# Patient Record
Sex: Female | Born: 1990 | Race: White | Hispanic: No | Smoking: Never smoker
Health system: Southern US, Community
[De-identification: ages and names within clinical notes are randomized; demographics above are authoritative.]

## PROBLEM LIST (undated history)

## (undated) ENCOUNTER — Inpatient Hospital Stay (HOSPITAL_COMMUNITY): Payer: Self-pay

## (undated) DIAGNOSIS — Z789 Other specified health status: Secondary | ICD-10-CM

## (undated) DIAGNOSIS — F32A Depression, unspecified: Secondary | ICD-10-CM

## (undated) HISTORY — DX: Depression, unspecified: F32.A

## (undated) HISTORY — PX: NO PAST SURGERIES: SHX2092

---

## 2016-03-13 LAB — OB RESULTS CONSOLE GC/CHLAMYDIA
Chlamydia: NEGATIVE
Gonorrhea: NEGATIVE

## 2016-03-26 NOTE — L&D Delivery Note (Signed)
  Andrea Romero, Andrea Romero [[id number]]  Delivery Note At 2:49 AM a viable female was delivered via  (Presentation: LOA).  APGAR: 1, 6, 9; weight 1180g.  Cord clamped and cut immediately and baby taken to warmer to be assessed by NICU team. See NICU note. Placenta status: Spontaneous, intact.  Cord: 3 vessels.  Cord pH: 7.29  Anesthesia:  none Episiotomy:  none Lacerations:  none Suture Repair: n/a Est. Blood Loss (mL):  500  Mom to postpartum.  Baby to NICU.  Andrea Romero SNM 08/11/2016, 3:36 AM     Andrea Romero, Andrea Romero [[id number]]  Delivery Note After approximately 15 minutes, patient began to feel the urge to push. Heart rate was in the 70's. Pushed with approximately 3 contractions. At 3:06 AM a viable female was delivered via  (Presentation: LOA).  APGAR: 7, 8; weight pending. Cord clamped and cut immediately and baby taken to warmer to be assessed by NICU team. See NICU note.    Placenta status: spontaneous, intact.  Cord: 3 vessels with a nuchal x1. Cord pH: 7.18  Anesthesia:  none Episiotomy:  none Lacerations:  none Suture Repair: n/a Est. Blood Loss (mL):  500  Mom to postpartum.  Baby to NICU.  Andrea Romero 08/11/2016, 3:36 AM

## 2016-03-27 DIAGNOSIS — Z3482 Encounter for supervision of other normal pregnancy, second trimester: Secondary | ICD-10-CM | POA: Diagnosis not present

## 2016-03-27 LAB — OB RESULTS CONSOLE HEPATITIS B SURFACE ANTIGEN: Hepatitis B Surface Ag: NEGATIVE

## 2016-03-27 LAB — OB RESULTS CONSOLE HIV ANTIBODY (ROUTINE TESTING): HIV: NONREACTIVE

## 2016-03-27 LAB — OB RESULTS CONSOLE HGB/HCT, BLOOD
HEMATOCRIT: 36 %
HEMOGLOBIN: 12.5 g/dL

## 2016-03-27 LAB — OB RESULTS CONSOLE ABO/RH: RH TYPE: POSITIVE

## 2016-03-27 LAB — OB RESULTS CONSOLE ANTIBODY SCREEN: ANTIBODY SCREEN: NEGATIVE

## 2016-03-27 LAB — OB RESULTS CONSOLE RPR: RPR: NONREACTIVE

## 2016-03-27 LAB — OB RESULTS CONSOLE RUBELLA ANTIBODY, IGM: Rubella: IMMUNE

## 2016-04-02 DIAGNOSIS — Z3A13 13 weeks gestation of pregnancy: Secondary | ICD-10-CM | POA: Diagnosis not present

## 2016-04-02 DIAGNOSIS — O30031 Twin pregnancy, monochorionic/diamniotic, first trimester: Secondary | ICD-10-CM | POA: Diagnosis not present

## 2016-04-02 DIAGNOSIS — Z3682 Encounter for antenatal screening for nuchal translucency: Secondary | ICD-10-CM | POA: Diagnosis not present

## 2016-04-23 DIAGNOSIS — Z8481 Family history of carrier of genetic disease: Secondary | ICD-10-CM | POA: Diagnosis not present

## 2016-04-23 DIAGNOSIS — O30032 Twin pregnancy, monochorionic/diamniotic, second trimester: Secondary | ICD-10-CM | POA: Diagnosis not present

## 2016-04-23 DIAGNOSIS — Z3A16 16 weeks gestation of pregnancy: Secondary | ICD-10-CM | POA: Diagnosis not present

## 2016-05-30 DIAGNOSIS — O26872 Cervical shortening, second trimester: Secondary | ICD-10-CM | POA: Diagnosis not present

## 2016-05-30 DIAGNOSIS — O30032 Twin pregnancy, monochorionic/diamniotic, second trimester: Secondary | ICD-10-CM | POA: Diagnosis not present

## 2016-05-30 DIAGNOSIS — Z3A21 21 weeks gestation of pregnancy: Secondary | ICD-10-CM | POA: Diagnosis not present

## 2016-05-30 DIAGNOSIS — O365920 Maternal care for other known or suspected poor fetal growth, second trimester, not applicable or unspecified: Secondary | ICD-10-CM | POA: Diagnosis not present

## 2016-06-06 ENCOUNTER — Encounter (HOSPITAL_COMMUNITY): Payer: Self-pay

## 2016-06-08 ENCOUNTER — Other Ambulatory Visit (HOSPITAL_COMMUNITY): Payer: Self-pay | Admitting: Maternal and Fetal Medicine

## 2016-06-08 DIAGNOSIS — O365931 Maternal care for other known or suspected poor fetal growth, third trimester, fetus 1: Secondary | ICD-10-CM

## 2016-06-08 DIAGNOSIS — O30039 Twin pregnancy, monochorionic/diamniotic, unspecified trimester: Secondary | ICD-10-CM

## 2016-06-08 DIAGNOSIS — Z3A23 23 weeks gestation of pregnancy: Secondary | ICD-10-CM

## 2016-06-12 ENCOUNTER — Encounter (HOSPITAL_COMMUNITY): Payer: Self-pay | Admitting: *Deleted

## 2016-06-13 ENCOUNTER — Ambulatory Visit (HOSPITAL_COMMUNITY)
Admission: RE | Admit: 2016-06-13 | Discharge: 2016-06-13 | Disposition: A | Discharge status: Home / Self Care | Payer: Medicaid Other | Source: Ambulatory Visit | Attending: Maternal and Fetal Medicine | Admitting: Maternal and Fetal Medicine

## 2016-06-13 ENCOUNTER — Encounter (HOSPITAL_COMMUNITY): Payer: Self-pay

## 2016-06-13 DIAGNOSIS — Z3689 Encounter for other specified antenatal screening: Secondary | ICD-10-CM | POA: Insufficient documentation

## 2016-06-13 DIAGNOSIS — Z3A23 23 weeks gestation of pregnancy: Secondary | ICD-10-CM | POA: Insufficient documentation

## 2016-06-13 DIAGNOSIS — O365921 Maternal care for other known or suspected poor fetal growth, second trimester, fetus 1: Secondary | ICD-10-CM | POA: Insufficient documentation

## 2016-06-13 DIAGNOSIS — O30039 Twin pregnancy, monochorionic/diamniotic, unspecified trimester: Secondary | ICD-10-CM

## 2016-06-13 DIAGNOSIS — O365931 Maternal care for other known or suspected poor fetal growth, third trimester, fetus 1: Secondary | ICD-10-CM

## 2016-06-13 DIAGNOSIS — O30032 Twin pregnancy, monochorionic/diamniotic, second trimester: Secondary | ICD-10-CM | POA: Diagnosis not present

## 2016-06-13 HISTORY — DX: Other specified health status: Z78.9

## 2016-06-14 ENCOUNTER — Other Ambulatory Visit (HOSPITAL_COMMUNITY): Payer: Self-pay | Admitting: *Deleted

## 2016-06-14 DIAGNOSIS — O365911 Maternal care for other known or suspected poor fetal growth, first trimester, fetus 1: Secondary | ICD-10-CM

## 2016-06-16 ENCOUNTER — Encounter (HOSPITAL_COMMUNITY): Payer: Self-pay

## 2016-06-16 ENCOUNTER — Inpatient Hospital Stay (HOSPITAL_COMMUNITY)
Admission: AD | Admit: 2016-06-16 | Discharge: 2016-06-16 | Disposition: A | Discharge status: Home / Self Care | Payer: Medicaid Other | Source: Ambulatory Visit | Attending: Obstetrics and Gynecology | Admitting: Obstetrics and Gynecology

## 2016-06-16 DIAGNOSIS — O99612 Diseases of the digestive system complicating pregnancy, second trimester: Secondary | ICD-10-CM | POA: Insufficient documentation

## 2016-06-16 DIAGNOSIS — R079 Chest pain, unspecified: Secondary | ICD-10-CM | POA: Diagnosis present

## 2016-06-16 DIAGNOSIS — K219 Gastro-esophageal reflux disease without esophagitis: Secondary | ICD-10-CM

## 2016-06-16 DIAGNOSIS — Z3A24 24 weeks gestation of pregnancy: Secondary | ICD-10-CM | POA: Insufficient documentation

## 2016-06-16 LAB — URINALYSIS, ROUTINE W REFLEX MICROSCOPIC
Bilirubin Urine: NEGATIVE
GLUCOSE, UA: 50 mg/dL — AB
HGB URINE DIPSTICK: NEGATIVE
KETONES UR: NEGATIVE mg/dL
Leukocytes, UA: NEGATIVE
Nitrite: NEGATIVE
PH: 7 (ref 5.0–8.0)
PROTEIN: NEGATIVE mg/dL
Specific Gravity, Urine: 1.008 (ref 1.005–1.030)

## 2016-06-16 MED ORDER — GI COCKTAIL ~~LOC~~
30.0000 mL | Freq: Once | ORAL | Status: AC
  Administered 2016-06-16: 30 mL via ORAL
  Filled 2016-06-16: qty 30

## 2016-06-16 NOTE — MAU Provider Note (Signed)
History   G3P0020 @ 24.1 wks twin gestation in with burning chest pain for several days duration. Pt gets PNC at [facility] in [place]. Pt states has pessary in place.  CSN: [id number]  Arrival date & time 06/16/16  2027   None     Chief Complaint  Patient presents with  . Chest Pain  . Abdominal Pain  . Shortness of Breath    HPI  Past Medical History:  Diagnosis Date  . Medical history non-contributory     Past Surgical History:  Procedure Laterality Date  . NO PAST SURGERIES      No family history on file.  Social History  Substance Use Topics  . Smoking status: Never Smoker  . Smokeless tobacco: Never Used  . Alcohol use No    OB History    Gravida Para Term Preterm AB Living   3       2     SAB TAB Ectopic Multiple Live Births   2              Review of Systems  Constitutional: Negative.   HENT: Negative.   Eyes: Negative.   Respiratory: Negative.   Cardiovascular: Positive for chest pain.  Gastrointestinal: Negative.   Endocrine: Negative.   Genitourinary: Negative.   Musculoskeletal: Negative.   Skin: Negative.   Neurological: Negative.   Hematological: Negative.   Psychiatric/Behavioral: Negative.     Allergies  Patient has no known allergies.  Home Medications    BP 100/78 (BP Location: Right Arm) Comment: Simultaneous filing. User may not have seen previous data. Comment (BP Location): Simultaneous filing. User may not have seen previous data.  Pulse (!) 112 Comment: Simultaneous filing. User may not have seen previous data.  Temp 99 F (37.2 C) (Oral) Comment: Simultaneous filing. User may not have seen previous data. Comment (Src): Simultaneous filing. User may not have seen previous data.  Resp 19 Comment: Simultaneous filing. User may not have seen previous data.  Ht 4\' 11"  (1.499 m)   Wt 134 lb (60.8 kg)   LMP 12/30/2015   SpO2 94%   BMI 27.06 kg/m   Physical Exam  Constitutional: She is oriented to person, place, and time.  She appears well-developed and well-nourished.  HENT:  Head: Normocephalic.  Eyes: Pupils are equal, round, and reactive to light.  Neck: Normal range of motion.  Cardiovascular: Normal rate, regular rhythm, normal heart sounds and intact distal pulses.   Pulmonary/Chest: Effort normal and breath sounds normal.  Abdominal: Soft. Bowel sounds are normal.  Genitourinary: Vagina normal and uterus normal.  Musculoskeletal: Normal range of motion.  Neurological: She is alert and oriented to person, place, and time. She has normal reflexes.  Skin: Skin is warm and dry.  Psychiatric: She has a normal mood and affect. Her behavior is normal. Judgment and thought content normal.    MAU Course  Procedures (including critical care time)  Labs Reviewed  URINALYSIS, ROUTINE W REFLEX MICROSCOPIC - Abnormal; Notable for the following:       Result Value   Glucose, UA 50 (*)    All other components within normal limits   No results found.   1. Gastroesophageal reflux disease without esophagitis       MDM  Sterile Spec exam per Dr. Emelda FearFerguson and cervix closed. GI cocktail has relieved chest discomfort and pt desires d/c home. OK for c/s per Dr. Emelda FearFerguson.

## 2016-06-16 NOTE — Discharge Instructions (Signed)
Food Choices for Gastroesophageal Reflux Disease, Adult When you have gastroesophageal reflux disease (GERD), the foods you eat and your eating habits are very important. Choosing the right foods can help ease the discomfort of GERD. Consider working with a diet and nutrition specialist (dietitian) to help you make healthy food choices. What general guidelines should I follow? Eating plan  Choose healthy foods low in fat, such as fruits, vegetables, whole grains, low-fat dairy products, and lean meat, fish, and poultry.  Eat frequent, small meals instead of three large meals each day. Eat your meals slowly, in a relaxed setting. Avoid bending over or lying down until 2-3 hours after eating.  Limit high-fat foods such as fatty meats or fried foods.  Limit your intake of oils, butter, and shortening to less than 8 teaspoons each day.  Avoid the following: ? Foods that cause symptoms. These may be different for different people. Keep a food diary to keep track of foods that cause symptoms. ? Alcohol. ? Drinking large amounts of liquid with meals. ? Eating meals during the 2-3 hours before bed.  Cook foods using methods other than frying. This may include baking, grilling, or broiling. Lifestyle   Maintain a healthy weight. Ask your health care provider what weight is healthy for you. If you need to lose weight, work with your health care provider to do so safely.  Exercise for at least 30 minutes on 5 or more days each week, or as told by your health care provider.  Avoid wearing clothes that fit tightly around your waist and chest.  Do not use any products that contain nicotine or tobacco, such as cigarettes and e-cigarettes. If you need help quitting, ask your health care provider.  Sleep with the head of your bed raised. Use a wedge under the mattress or blocks under the bed frame to raise the head of the bed. What foods are not recommended? The items listed may not be a complete  list. Talk with your dietitian about what dietary choices are best for you. Grains Pastries or quick breads with added fat. French toast. Vegetables Deep fried vegetables. French fries. Any vegetables prepared with added fat. Any vegetables that cause symptoms. For some people this may include tomatoes and tomato products, chili peppers, onions and garlic, and horseradish. Fruits Any fruits prepared with added fat. Any fruits that cause symptoms. For some people this may include citrus fruits, such as oranges, grapefruit, pineapple, and lemons. Meats and other protein foods High-fat meats, such as fatty beef or pork, hot dogs, ribs, ham, sausage, salami and bacon. Fried meat or protein, including fried fish and fried chicken. Nuts and nut butters. Dairy Whole milk and chocolate milk. Sour cream. Cream. Ice cream. Cream cheese. Milk shakes. Beverages Coffee and tea, with or without caffeine. Carbonated beverages. Sodas. Energy drinks. Fruit juice made with acidic fruits (such as orange or grapefruit). Tomato juice. Alcoholic drinks. Fats and oils Butter. Margarine. Shortening. Ghee. Sweets and desserts Chocolate and cocoa. Donuts. Seasoning and other foods Pepper. Peppermint and spearmint. Any condiments, herbs, or seasonings that cause symptoms. For some people, this may include curry, hot sauce, or vinegar-based salad dressings. Summary  When you have gastroesophageal reflux disease (GERD), food and lifestyle choices are very important to help ease the discomfort of GERD.  Eat frequent, small meals instead of three large meals each day. Eat your meals slowly, in a relaxed setting. Avoid bending over or lying down until 2-3 hours after eating.  Limit high-fat   foods such as fatty meat or fried foods. This information is not intended to replace advice given to you by your health care provider. Make sure you discuss any questions you have with your health care provider. Document Released:  03/12/2005 Document Revised: 03/13/2016 Document Reviewed: 03/13/2016 Elsevier Interactive Patient Education  2017 Elsevier Inc.  

## 2016-06-16 NOTE — MAU Note (Signed)
Pt presents with c/o CP and SOB all day as well as lower abdominal cramping. Pt rates both CP and abdominal pain as 7/10. Has not taken anything for pain. Denies LOF or vag bleeding. +FM. Has had some nausea but denies vomiting.

## 2016-06-19 ENCOUNTER — Encounter: Payer: Self-pay | Admitting: *Deleted

## 2016-06-20 ENCOUNTER — Encounter (HOSPITAL_COMMUNITY): Payer: Self-pay

## 2016-06-20 ENCOUNTER — Other Ambulatory Visit (HOSPITAL_COMMUNITY): Payer: Self-pay | Admitting: Maternal and Fetal Medicine

## 2016-06-20 ENCOUNTER — Ambulatory Visit (HOSPITAL_COMMUNITY)
Admission: RE | Admit: 2016-06-20 | Discharge: 2016-06-20 | Disposition: A | Discharge status: Home / Self Care | Payer: Medicaid Other | Source: Ambulatory Visit | Attending: Maternal and Fetal Medicine | Admitting: Maternal and Fetal Medicine

## 2016-06-20 DIAGNOSIS — O30032 Twin pregnancy, monochorionic/diamniotic, second trimester: Secondary | ICD-10-CM

## 2016-06-20 DIAGNOSIS — O365911 Maternal care for other known or suspected poor fetal growth, first trimester, fetus 1: Secondary | ICD-10-CM

## 2016-06-20 DIAGNOSIS — O365921 Maternal care for other known or suspected poor fetal growth, second trimester, fetus 1: Secondary | ICD-10-CM | POA: Insufficient documentation

## 2016-06-20 DIAGNOSIS — Z3A24 24 weeks gestation of pregnancy: Secondary | ICD-10-CM | POA: Diagnosis not present

## 2016-06-27 ENCOUNTER — Ambulatory Visit (HOSPITAL_COMMUNITY)
Admission: RE | Admit: 2016-06-27 | Discharge: 2016-06-27 | Disposition: A | Discharge status: Home / Self Care | Payer: Medicaid Other | Source: Ambulatory Visit | Attending: Maternal and Fetal Medicine | Admitting: Maternal and Fetal Medicine

## 2016-06-27 ENCOUNTER — Encounter: Payer: Self-pay | Admitting: *Deleted

## 2016-06-27 ENCOUNTER — Encounter (HOSPITAL_COMMUNITY): Payer: Self-pay

## 2016-06-27 DIAGNOSIS — O365911 Maternal care for other known or suspected poor fetal growth, first trimester, fetus 1: Secondary | ICD-10-CM

## 2016-06-27 DIAGNOSIS — Z3A25 25 weeks gestation of pregnancy: Secondary | ICD-10-CM | POA: Insufficient documentation

## 2016-06-27 DIAGNOSIS — O30032 Twin pregnancy, monochorionic/diamniotic, second trimester: Secondary | ICD-10-CM | POA: Insufficient documentation

## 2016-06-28 ENCOUNTER — Other Ambulatory Visit (HOSPITAL_COMMUNITY): Payer: Self-pay | Admitting: *Deleted

## 2016-06-28 DIAGNOSIS — O30033 Twin pregnancy, monochorionic/diamniotic, third trimester: Secondary | ICD-10-CM

## 2016-07-04 ENCOUNTER — Ambulatory Visit (HOSPITAL_COMMUNITY)
Admission: RE | Admit: 2016-07-04 | Discharge: 2016-07-04 | Disposition: A | Discharge status: Home / Self Care | Payer: Medicaid Other | Source: Ambulatory Visit | Attending: Obstetrics and Gynecology | Admitting: Obstetrics and Gynecology

## 2016-07-04 ENCOUNTER — Encounter (HOSPITAL_COMMUNITY): Payer: Self-pay

## 2016-07-04 ENCOUNTER — Ambulatory Visit (INDEPENDENT_AMBULATORY_CARE_PROVIDER_SITE_OTHER): Payer: Medicaid Other | Admitting: Obstetrics and Gynecology

## 2016-07-04 ENCOUNTER — Encounter: Payer: Self-pay | Admitting: Obstetrics and Gynecology

## 2016-07-04 DIAGNOSIS — O365921 Maternal care for other known or suspected poor fetal growth, second trimester, fetus 1: Secondary | ICD-10-CM

## 2016-07-04 DIAGNOSIS — O0992 Supervision of high risk pregnancy, unspecified, second trimester: Secondary | ICD-10-CM

## 2016-07-04 DIAGNOSIS — O30032 Twin pregnancy, monochorionic/diamniotic, second trimester: Secondary | ICD-10-CM

## 2016-07-04 DIAGNOSIS — Z23 Encounter for immunization: Secondary | ICD-10-CM

## 2016-07-04 DIAGNOSIS — O0990 Supervision of high risk pregnancy, unspecified, unspecified trimester: Secondary | ICD-10-CM | POA: Insufficient documentation

## 2016-07-04 DIAGNOSIS — Z3A26 26 weeks gestation of pregnancy: Secondary | ICD-10-CM | POA: Diagnosis not present

## 2016-07-04 DIAGNOSIS — Z362 Encounter for other antenatal screening follow-up: Secondary | ICD-10-CM | POA: Insufficient documentation

## 2016-07-04 DIAGNOSIS — O365931 Maternal care for other known or suspected poor fetal growth, third trimester, fetus 1: Secondary | ICD-10-CM | POA: Insufficient documentation

## 2016-07-04 DIAGNOSIS — O365911 Maternal care for other known or suspected poor fetal growth, first trimester, fetus 1: Secondary | ICD-10-CM | POA: Diagnosis present

## 2016-07-04 DIAGNOSIS — O30039 Twin pregnancy, monochorionic/diamniotic, unspecified trimester: Secondary | ICD-10-CM | POA: Insufficient documentation

## 2016-07-04 NOTE — Progress Notes (Signed)
Here for first ob visit. Transferring care from [place]. Given new patient educaton booklets. Has eaten today- unable to do 2 hour gtt. Will schedule at next visit. Signed up for [employer] app.

## 2016-07-04 NOTE — Patient Instructions (Signed)
AREA PEDIATRIC/FAMILY PRACTICE PHYSICIANS  [facility] [address], [address] [place], [place]  [address] Phone - [phone]   Fax - [phone]  [facility] [address] [place], [place] [address] Phone - [phone]   Fax - [phone]  [facility] [address] [place], [place]  [address] Phone - [phone]   Fax - [phone]  [facility] [address] [place], [place]  [address] Phone - [phone]   Fax - [phone]  [facility] [address] [place], [place]  [address] Phone - [phone]   Fax - [phone]  [facility] [address] [place], [place]  [address] Phone - [phone]   Fax - [phone]  [facility] [address] [place], [place]  [address] Phone - [phone]   Fax - [phone]  [facility] [address] [place], [place]  [address] Phone - [phone]   Fax - [phone]  [facility] [address] [place], [place]  [address] Phone - [phone]   Fax - [phone] [facility] [address] [place], [place]  [address] Phone - [phone]   Fax - [phone]  [facility] [address] [place], [place]  [address] Phone - [phone]   Fax - [phone]  [facility] [address] [place], [place]  [address] Phone - [phone]   Fax - [phone]  [facility] [address] [place], [place]  [address] Phone - [phone]   Fax - [phone]  [facility] GOSRANI [address] [place], [place]  [address] Phone - [phone]  [facility] [address] [place], [place]  [address] Phone - [phone]   Fax - [phone]  [facility] [address] [place], [place]  [address] Phone - [phone]   Fax - [phone]  [facility] [address] [place], [place]  [address] Phone - [phone]   Fax - [phone]  [facility] [address] [place], [place]  [address] Phone - [phone]   Fax - [phone]   [facility] [address] [place], [place]  [address] Phone - [phone]   Fax - [phone]  [facility] [address] [place], [place]  [address] Phone - [phone]   Fax - [phone]  DAVID RUBIN [address] [place], [place]  [address] Phone - [phone]   Fax - [phone]  [facility] [address] [place], [place]  [address] Phone - [phone]   Fax - [phone]  [facility] [address] [place], [place]  [address] Phone - [phone]   Fax - [phone] [facility] [address] [place], [place]  [address] Phone - [phone]   Fax - [phone]  [facility] [address] [place], [place]  [address] Phone - [phone]   Fax - [phone]  [facility] [address] [place],   [address] Phone - [phone]   Fax - [phone]  [facility] Charlene Flemming MD [address] [place]  [address] Phone [phone]  Fax [phone]   

## 2016-07-04 NOTE — Progress Notes (Signed)
Notified Hoy Finlay @ [phone] in regards to getting an appt for NICU tour.  Herbert Seta stated that she would give pt a call and schedule an appt some day as her next appt pt has scheduled.  Pt notified of recommendation.

## 2016-07-05 NOTE — Progress Notes (Signed)
New OB Note  07/04/2016  Clinic: [facility]  Chief Complaint: NOB  Transfer of Care Patient: Andrea Romero  History of Present Illness: Ms. Doetsch is a 26 y.o. G3P0020 @ 266 weeks (EDC 7/13, based on Patient's last menstrual period was 12/30/2015.=1st trimester u/s).  Preg complicated by has Supervision of high-risk pregnancy; Monochorionic diamniotic twin pregnancy; and FGR fetus A with intermittent AEDF (06/27/2016) on her problem list.   No s/s of preterm labor or decrease fetal movement  ROS: A 12-point review of systems was performed and negative, except as stated in the above HPI.  OBGYN History: As per HPI. OB History  Gravida Para Term Preterm AB Living  3       2    SAB TAB Ectopic Multiple Live Births  2            # Outcome Date GA Lbr Len/2nd Weight Sex Delivery Anes PTL Lv  3 Current           2 SAB 2017 [redacted]w[redacted]d         1 SAB 2016 [redacted]w[redacted]d            Any issues with any prior pregnancies: not applicable Prior children are healthy, doing well, and without any problems or issues: not applicable History of pap smears: Yes. Last pap smear 02/2016 and results were negative)   Past Medical History: Past Medical History:  Diagnosis Date  . Medical history non-contributory     Past Surgical History: Past Surgical History:  Procedure Laterality Date  . NO PAST SURGERIES      Family History:  No family history on file.  Social History:  Social History   Social History  . Marital status: Single    Spouse name: N/A  . Number of children: N/A  . Years of education: N/A   Occupational History  . Not on file.   Social History Main Topics  . Smoking status: Never Smoker  . Smokeless tobacco: Never Used  . Alcohol use No  . Drug use: No  . Sexual activity: Yes    Birth control/ protection: None   Other Topics Concern  . Not on file   Social History Narrative  . No narrative on file    Allergy: No Known Allergies  Health  Maintenance:  Mammogram Up to Date: not applicable  Current Outpatient Medications: PNV, ASA  Physical Exam:   BP 108/68   Pulse (!) 114   Wt 136 lb (61.7 kg)   LMP 12/30/2015   BMI 27.47 kg/m  Body mass index is 27.47 kg/m. Contractions: Not present Vag. Bleeding: None. FHTs: normal x 2  General appearance: Well nourished, well developed female in no acute distress.   Laboratory: O pos/Rub Imm/rpr neg/hiv neg/hepB neg/CBC neg/GC-CT neg/UCx Negative: CF97, fragile X screening, 1st trimester screen, AFP tetra  Imaging:  03/14/16: @ 10/5 weeks 06/28/16:  A: BPP 8/8, efw 14% 588gm, AC <3%, AFI 4.6, elevated S/D with intermittent AEDF B: BPP 8/8, efw 61%, 894gm, AC 79%, AFI 6.8, normal dopplers.   Assessment: pt stable  Plan: 1. Supervision of high risk pregnancy, antepartum Routine care. D/w pt re: BC nv. tdap and 28wk labs nv - Flu Vaccine QUAD 36+ mos IM  2. Monochorionic diamniotic twin pregnancy, antepartum Followed by mfm already with serial BPPs and UA for FGR. No e/o TTTS s/p normal fetal echo - Flu Vaccine QUAD 36+ mos IM  3. Poor fetal growth affecting management of mother  in second trimester, fetus 1 of multiple gestation Pt states MFM held off on BMZ course for now Follow up subsequent scans for delivery planning. Follow up u/s for today  Problem list reviewed and updated.  Follow up in 2 weeks.  The nature of [facility] with multiple MDs and other Advanced Practice Providers was explained to patient; also emphasized that residents, students are part of our team.  >50% of 30 min visit spent on counseling and coordination of care.     Cornelia Copa MD Attending Center for [facility])

## 2016-07-09 ENCOUNTER — Encounter (HOSPITAL_COMMUNITY): Payer: Self-pay

## 2016-07-09 NOTE — Progress Notes (Signed)
Patient scheduled for a NICU tour on 07/18/16 at 2:00, prior to her MFM appointment. Patient is to come to 2nd floor, NICU, and ring buzzer outside of the unit and someone will come and speak with her.

## 2016-07-11 ENCOUNTER — Encounter (HOSPITAL_COMMUNITY): Payer: Self-pay

## 2016-07-11 ENCOUNTER — Ambulatory Visit (HOSPITAL_COMMUNITY)
Admission: RE | Admit: 2016-07-11 | Discharge: 2016-07-11 | Disposition: A | Discharge status: Home / Self Care | Payer: Medicaid Other | Source: Ambulatory Visit | Attending: Obstetrics and Gynecology | Admitting: Obstetrics and Gynecology

## 2016-07-11 ENCOUNTER — Other Ambulatory Visit (HOSPITAL_COMMUNITY): Payer: Self-pay | Admitting: Maternal and Fetal Medicine

## 2016-07-11 DIAGNOSIS — Z3A27 27 weeks gestation of pregnancy: Secondary | ICD-10-CM

## 2016-07-11 DIAGNOSIS — O365921 Maternal care for other known or suspected poor fetal growth, second trimester, fetus 1: Secondary | ICD-10-CM

## 2016-07-11 DIAGNOSIS — O365911 Maternal care for other known or suspected poor fetal growth, first trimester, fetus 1: Secondary | ICD-10-CM

## 2016-07-11 DIAGNOSIS — Z362 Encounter for other antenatal screening follow-up: Secondary | ICD-10-CM | POA: Insufficient documentation

## 2016-07-11 DIAGNOSIS — O30032 Twin pregnancy, monochorionic/diamniotic, second trimester: Secondary | ICD-10-CM | POA: Diagnosis not present

## 2016-07-16 ENCOUNTER — Ambulatory Visit (INDEPENDENT_AMBULATORY_CARE_PROVIDER_SITE_OTHER): Payer: Medicaid Other | Admitting: Family Medicine

## 2016-07-16 VITALS — BP 125/84 | HR 95 | Wt 138.9 lb

## 2016-07-16 DIAGNOSIS — O30033 Twin pregnancy, monochorionic/diamniotic, third trimester: Secondary | ICD-10-CM | POA: Diagnosis not present

## 2016-07-16 DIAGNOSIS — O30039 Twin pregnancy, monochorionic/diamniotic, unspecified trimester: Secondary | ICD-10-CM

## 2016-07-16 DIAGNOSIS — O0993 Supervision of high risk pregnancy, unspecified, third trimester: Secondary | ICD-10-CM | POA: Diagnosis not present

## 2016-07-16 DIAGNOSIS — O0990 Supervision of high risk pregnancy, unspecified, unspecified trimester: Secondary | ICD-10-CM

## 2016-07-16 DIAGNOSIS — O365931 Maternal care for other known or suspected poor fetal growth, third trimester, fetus 1: Secondary | ICD-10-CM | POA: Diagnosis not present

## 2016-07-16 MED ORDER — TETANUS-DIPHTH-ACELL PERTUSSIS 5-2.5-18.5 LF-MCG/0.5 IM SUSP
0.5000 mL | Freq: Once | INTRAMUSCULAR | Status: AC
  Administered 2016-07-16: 0.5 mL via INTRAMUSCULAR

## 2016-07-16 NOTE — Patient Instructions (Signed)
 Third Trimester of Pregnancy The third trimester is from week 28 through week 40 (months 7 through 9). The third trimester is a time when the unborn baby (fetus) is growing rapidly. At the end of the ninth month, the fetus is about 20 inches in length and weighs 6-10 pounds. Body changes during your third trimester Your body will continue to go through many changes during pregnancy. The changes vary from woman to woman. During the third trimester:  Your weight will continue to increase. You can expect to gain 25-35 pounds (11-16 kg) by the end of the pregnancy.  You may begin to get stretch marks on your hips, abdomen, and breasts.  You may urinate more often because the fetus is moving lower into your pelvis and pressing on your bladder.  You may develop or continue to have heartburn. This is caused by increased hormones that slow down muscles in the digestive tract.  You may develop or continue to have constipation because increased hormones slow digestion and cause the muscles that push waste through your intestines to relax.  You may develop hemorrhoids. These are swollen veins (varicose veins) in the rectum that can itch or be painful.  You may develop swollen, bulging veins (varicose veins) in your legs.  You may have increased body aches in the pelvis, back, or thighs. This is due to weight gain and increased hormones that are relaxing your joints.  You may have changes in your hair. These can include thickening of your hair, rapid growth, and changes in texture. Some women also have hair loss during or after pregnancy, or hair that feels dry or thin. Your hair will most likely return to normal after your baby is born.  Your breasts will continue to grow and they will continue to become tender. A yellow fluid (colostrum) may leak from your breasts. This is the first milk you are producing for your baby.  Your belly button may stick out.  You may notice more swelling in your  hands, face, or ankles.  You may have increased tingling or numbness in your hands, arms, and legs. The skin on your belly may also feel numb.  You may feel short of breath because of your expanding uterus.  You may have more problems sleeping. This can be caused by the size of your belly, increased need to urinate, and an increase in your body's metabolism.  You may notice the fetus "dropping," or moving lower in your abdomen (lightening).  You may have increased vaginal discharge.  You may notice your joints feel loose and you may have pain around your pelvic bone.  What to expect at prenatal visits You will have prenatal exams every 2 weeks until week 36. Then you will have weekly prenatal exams. During a routine prenatal visit:  You will be weighed to make sure you and the baby are growing normally.  Your blood pressure will be taken.  Your abdomen will be measured to track your baby's growth.  The fetal heartbeat will be listened to.  Any test results from the previous visit will be discussed.  You may have a cervical check near your due date to see if your cervix has softened or thinned (effaced).  You will be tested for Group B streptococcus. This happens between 35 and 37 weeks.  Your health care provider may ask you:  What your birth plan is.  How you are feeling.  If you are feeling the baby move.  If you have   had any abnormal symptoms, such as leaking fluid, bleeding, severe headaches, or abdominal cramping.  If you are using any tobacco products, including cigarettes, chewing tobacco, and electronic cigarettes.  If you have any questions.  Other tests or screenings that may be performed during your third trimester include:  Blood tests that check for low iron levels (anemia).  Fetal testing to check the health, activity level, and growth of the fetus. Testing is done if you have certain medical conditions or if there are problems during the  pregnancy.  Nonstress test (NST). This test checks the health of your baby to make sure there are no signs of problems, such as the baby not getting enough oxygen. During this test, a belt is placed around your belly. The baby is made to move, and its heart rate is monitored during movement.  What is false labor? False labor is a condition in which you feel small, irregular tightenings of the muscles in the womb (contractions) that usually go away with rest, changing position, or drinking water. These are called Braxton Hicks contractions. Contractions may last for hours, days, or even weeks before true labor sets in. If contractions come at regular intervals, become more frequent, increase in intensity, or become painful, you should see your health care provider. What are the signs of labor?  Abdominal cramps.  Regular contractions that start at 10 minutes apart and become stronger and more frequent with time.  Contractions that start on the top of the uterus and spread down to the lower abdomen and back.  Increased pelvic pressure and dull back pain.  A watery or bloody mucus discharge that comes from the vagina.  Leaking of amniotic fluid. This is also known as your "water breaking." It could be a slow trickle or a gush. Let your health care provider know if it has a color or strange odor. If you have any of these signs, call your health care provider right away, even if it is before your due date. Follow these instructions at home: Medicines  Follow your health care provider's instructions regarding medicine use. Specific medicines may be either safe or unsafe to take during pregnancy.  Take a prenatal vitamin that contains at least 600 micrograms (mcg) of folic acid.  If you develop constipation, try taking a stool softener if your health care provider approves. Eating and drinking  Eat a balanced diet that includes fresh fruits and vegetables, whole grains, good sources of protein  such as meat, eggs, or tofu, and low-fat dairy. Your health care provider will help you determine the amount of weight gain that is right for you.  Avoid raw meat and uncooked cheese. These carry germs that can cause birth defects in the baby.  If you have low calcium intake from food, talk to your health care provider about whether you should take a daily calcium supplement.  Eat four or five small meals rather than three large meals a day.  Limit foods that are high in fat and processed sugars, such as fried and sweet foods.  To prevent constipation: ? Drink enough fluid to keep your urine clear or pale yellow. ? Eat foods that are high in fiber, such as fresh fruits and vegetables, whole grains, and beans. Activity  Exercise only as directed by your health care provider. Most women can continue their usual exercise routine during pregnancy. Try to exercise for 30 minutes at least 5 days a week. Stop exercising if you experience uterine contractions.  Avoid   heavy lifting.  Do not exercise in extreme heat or humidity, or at high altitudes.  Wear low-heel, comfortable shoes.  Practice good posture.  You may continue to have sex unless your health care provider tells you otherwise. Relieving pain and discomfort  Take frequent breaks and rest with your legs elevated if you have leg cramps or low back pain.  Take warm sitz baths to soothe any pain or discomfort caused by hemorrhoids. Use hemorrhoid cream if your health care provider approves.  Wear a good support bra to prevent discomfort from breast tenderness.  If you develop varicose veins: ? Wear support pantyhose or compression stockings as told by your healthcare provider. ? Elevate your feet for 15 minutes, 3-4 times a day. Prenatal care  Write down your questions. Take them to your prenatal visits.  Keep all your prenatal visits as told by your health care provider. This is important. Safety  Wear your seat belt at  all times when driving.  Make a list of emergency phone numbers, including numbers for family, friends, the hospital, and police and fire departments. General instructions  Avoid cat litter boxes and soil used by cats. These carry germs that can cause birth defects in the baby. If you have a cat, ask someone to clean the litter box for you.  Do not travel far distances unless it is absolutely necessary and only with the approval of your health care provider.  Do not use hot tubs, steam rooms, or saunas.  Do not drink alcohol.  Do not use any products that contain nicotine or tobacco, such as cigarettes and e-cigarettes. If you need help quitting, ask your health care provider.  Do not use any medicinal herbs or unprescribed drugs. These chemicals affect the formation and growth of the baby.  Do not douche or use tampons or scented sanitary pads.  Do not cross your legs for long periods of time.  To prepare for the arrival of your baby: ? Take prenatal classes to understand, practice, and ask questions about labor and delivery. ? Make a trial run to the hospital. ? Visit the hospital and tour the maternity area. ? Arrange for maternity or paternity leave through employers. ? Arrange for family and friends to take care of pets while you are in the hospital. ? Purchase a rear-facing car seat and make sure you know how to install it in your car. ? Pack your hospital bag. ? Prepare the baby's nursery. Make sure to remove all pillows and stuffed animals from the baby's crib to prevent suffocation.  Visit your dentist if you have not gone during your pregnancy. Use a soft toothbrush to brush your teeth and be gentle when you floss. Contact a health care provider if:  You are unsure if you are in labor or if your water has broken.  You become dizzy.  You have mild pelvic cramps, pelvic pressure, or nagging pain in your abdominal area.  You have lower back pain.  You have persistent  nausea, vomiting, or diarrhea.  You have an unusual or bad smelling vaginal discharge.  You have pain when you urinate. Get help right away if:  Your water breaks before 37 weeks.  You have regular contractions less than 5 minutes apart before 37 weeks.  You have a fever.  You are leaking fluid from your vagina.  You have spotting or bleeding from your vagina.  You have severe abdominal pain or cramping.  You have rapid weight loss or weight   gain.  You have shortness of breath with chest pain.  You notice sudden or extreme swelling of your face, hands, ankles, feet, or legs.  Your baby makes fewer than 10 movements in 2 hours.  You have severe headaches that do not go away when you take medicine.  You have vision changes. Summary  The third trimester is from week 28 through week 40, months 7 through 9. The third trimester is a time when the unborn baby (fetus) is growing rapidly.  During the third trimester, your discomfort may increase as you and your baby continue to gain weight. You may have abdominal, leg, and back pain, sleeping problems, and an increased need to urinate.  During the third trimester your breasts will keep growing and they will continue to become tender. A yellow fluid (colostrum) may leak from your breasts. This is the first milk you are producing for your baby.  False labor is a condition in which you feel small, irregular tightenings of the muscles in the womb (contractions) that eventually go away. These are called Braxton Hicks contractions. Contractions may last for hours, days, or even weeks before true labor sets in.  Signs of labor can include: abdominal cramps; regular contractions that start at 10 minutes apart and become stronger and more frequent with time; watery or bloody mucus discharge that comes from the vagina; increased pelvic pressure and dull back pain; and leaking of amniotic fluid. This information is not intended to replace advice  given to you by your health care provider. Make sure you discuss any questions you have with your health care provider. Document Released: 03/06/2001 Document Revised: 08/18/2015 Document Reviewed: 05/13/2012 [employer] Interactive Patient Education  2017 [employer]   Breastfeeding Deciding to breastfeed is one of the best choices you can make for you and your baby. A change in hormones during pregnancy causes your breast tissue to grow and increases the number and size of your milk ducts. These hormones also allow proteins, sugars, and fats from your blood supply to make breast milk in your milk-producing glands. Hormones prevent breast milk from being released before your baby is born as well as prompt milk flow after birth. Once breastfeeding has begun, thoughts of your baby, as well as his or her sucking or crying, can stimulate the release of milk from your milk-producing glands. Benefits of breastfeeding For Your Baby  Your first milk (colostrum) helps your baby's digestive system function better.  There are antibodies in your milk that help your baby fight off infections.  Your baby has a lower incidence of asthma, allergies, and sudden infant death syndrome.  The nutrients in breast milk are better for your baby than infant formulas and are designed uniquely for your baby's needs.  Breast milk improves your baby's brain development.  Your baby is less likely to develop other conditions, such as childhood obesity, asthma, or type 2 diabetes mellitus.  For You  Breastfeeding helps to create a very special bond between you and your baby.  Breastfeeding is convenient. Breast milk is always available at the correct temperature and costs nothing.  Breastfeeding helps to burn calories and helps you lose the weight gained during pregnancy.  Breastfeeding makes your uterus contract to its prepregnancy size faster and slows bleeding (lochia) after you give birth.  Breastfeeding helps  to lower your risk of developing type 2 diabetes mellitus, osteoporosis, and breast or ovarian cancer later in life.  Signs that your baby is hungry Early Signs of Hunger    Increased alertness or activity.  Stretching.  Movement of the head from side to side.  Movement of the head and opening of the mouth when the corner of the mouth or cheek is stroked (rooting).  Increased sucking sounds, smacking lips, cooing, sighing, or squeaking.  Hand-to-mouth movements.  Increased sucking of fingers or hands.  Late Signs of Hunger  Fussing.  Intermittent crying.  Extreme Signs of Hunger Signs of extreme hunger will require calming and consoling before your baby will be able to breastfeed successfully. Do not wait for the following signs of extreme hunger to occur before you initiate breastfeeding:  Restlessness.  A loud, strong cry.  Screaming.  Breastfeeding basics Breastfeeding Initiation  Find a comfortable place to sit or lie down, with your neck and back well supported.  Place a pillow or rolled up blanket under your baby to bring him or her to the level of your breast (if you are seated). Nursing pillows are specially designed to help support your arms and your baby while you breastfeed.  Make sure that your baby's abdomen is facing your abdomen.  Gently massage your breast. With your fingertips, massage from your chest wall toward your nipple in a circular motion. This encourages milk flow. You may need to continue this action during the feeding if your milk flows slowly.  Support your breast with 4 fingers underneath and your thumb above your nipple. Make sure your fingers are well away from your nipple and your baby's mouth.  Stroke your baby's lips gently with your finger or nipple.  When your baby's mouth is open wide enough, quickly bring your baby to your breast, placing your entire nipple and as much of the colored area around your nipple (areola) as possible into  your baby's mouth. ? More areola should be visible above your baby's upper lip than below the lower lip. ? Your baby's tongue should be between his or her lower gum and your breast.  Ensure that your baby's mouth is correctly positioned around your nipple (latched). Your baby's lips should create a seal on your breast and be turned out (everted).  It is common for your baby to suck about 2-3 minutes in order to start the flow of breast milk.  Latching Teaching your baby how to latch on to your breast properly is very important. An improper latch can cause nipple pain and decreased milk supply for you and poor weight gain in your baby. Also, if your baby is not latched onto your nipple properly, he or she may swallow some air during feeding. This can make your baby fussy. Burping your baby when you switch breasts during the feeding can help to get rid of the air. However, teaching your baby to latch on properly is still the best way to prevent fussiness from swallowing air while breastfeeding. Signs that your baby has successfully latched on to your nipple:  Silent tugging or silent sucking, without causing you pain.  Swallowing heard between every 3-4 sucks.  Muscle movement above and in front of his or her ears while sucking.  Signs that your baby has not successfully latched on to nipple:  Sucking sounds or smacking sounds from your baby while breastfeeding.  Nipple pain.  If you think your baby has not latched on correctly, slip your finger into the corner of your baby's mouth to break the suction and place it between your baby's gums. Attempt breastfeeding initiation again. Signs of Successful Breastfeeding Signs from your baby:  A   gradual decrease in the number of sucks or complete cessation of sucking.  Falling asleep.  Relaxation of his or her body.  Retention of a small amount of milk in his or her mouth.  Letting go of your breast by himself or herself.  Signs from  you:  Breasts that have increased in firmness, weight, and size 1-3 hours after feeding.  Breasts that are softer immediately after breastfeeding.  Increased milk volume, as well as a change in milk consistency and color by the fifth day of breastfeeding.  Nipples that are not sore, cracked, or bleeding.  Signs That Your Baby is Getting Enough Milk  Wetting at least 1-2 diapers during the first 24 hours after birth.  Wetting at least 5-6 diapers every 24 hours for the first week after birth. The urine should be clear or pale yellow by 5 days after birth.  Wetting 6-8 diapers every 24 hours as your baby continues to grow and develop.  At least 3 stools in a 24-hour period by age 5 days. The stool should be soft and yellow.  At least 3 stools in a 24-hour period by age 7 days. The stool should be seedy and yellow.  No loss of weight greater than 10% of birth weight during the first 3 days of age.  Average weight gain of 4-7 ounces (113-198 g) per week after age 4 days.  Consistent daily weight gain by age 5 days, without weight loss after the age of 2 weeks.  After a feeding, your baby may spit up a small amount. This is common. Breastfeeding frequency and duration Frequent feeding will help you make more milk and can prevent sore nipples and breast engorgement. Breastfeed when you feel the need to reduce the fullness of your breasts or when your baby shows signs of hunger. This is called "breastfeeding on demand." Avoid introducing a pacifier to your baby while you are working to establish breastfeeding (the first 4-6 weeks after your baby is born). After this time you may choose to use a pacifier. Research has shown that pacifier use during the first year of a baby's life decreases the risk of sudden infant death syndrome (SIDS). Allow your baby to feed on each breast as long as he or she wants. Breastfeed until your baby is finished feeding. When your baby unlatches or falls asleep  while feeding from the first breast, offer the second breast. Because newborns are often sleepy in the first few weeks of life, you may need to awaken your baby to get him or her to feed. Breastfeeding times will vary from baby to baby. However, the following rules can serve as a guide to help you ensure that your baby is properly fed:  Newborns (babies 4 weeks of age or younger) may breastfeed every 1-3 hours.  Newborns should not go longer than 3 hours during the day or 5 hours during the night without breastfeeding.  You should breastfeed your baby a minimum of 8 times in a 24-hour period until you begin to introduce solid foods to your baby at around 6 months of age.  Breast milk pumping Pumping and storing breast milk allows you to ensure that your baby is exclusively fed your breast milk, even at times when you are unable to breastfeed. This is especially important if you are going back to work while you are still breastfeeding or when you are not able to be present during feedings. Your lactation consultant can give you guidelines on how   long it is safe to store breast milk. A breast pump is a machine that allows you to pump milk from your breast into a sterile bottle. The pumped breast milk can then be stored in a refrigerator or freezer. Some breast pumps are operated by hand, while others use electricity. Ask your lactation consultant which type will work best for you. Breast pumps can be purchased, but some hospitals and breastfeeding support groups lease breast pumps on a monthly basis. A lactation consultant can teach you how to hand express breast milk, if you prefer not to use a pump. Caring for your breasts while you breastfeed Nipples can become dry, cracked, and sore while breastfeeding. The following recommendations can help keep your breasts moisturized and healthy:  Avoid using soap on your nipples.  Wear a supportive bra. Although not required, special nursing bras and tank  tops are designed to allow access to your breasts for breastfeeding without taking off your entire bra or top. Avoid wearing underwire-style bras or extremely tight bras.  Air dry your nipples for 3-4minutes after each feeding.  Use only cotton bra pads to absorb leaked breast milk. Leaking of breast milk between feedings is normal.  Use lanolin on your nipples after breastfeeding. Lanolin helps to maintain your skin's normal moisture barrier. If you use pure lanolin, you do not need to wash it off before feeding your baby again. Pure lanolin is not toxic to your baby. You may also hand express a few drops of breast milk and gently massage that milk into your nipples and allow the milk to air dry.  In the first few weeks after giving birth, some women experience extremely full breasts (engorgement). Engorgement can make your breasts feel heavy, warm, and tender to the touch. Engorgement peaks within 3-5 days after you give birth. The following recommendations can help ease engorgement:  Completely empty your breasts while breastfeeding or pumping. You may want to start by applying warm, moist heat (in the shower or with warm water-soaked hand towels) just before feeding or pumping. This increases circulation and helps the milk flow. If your baby does not completely empty your breasts while breastfeeding, pump any extra milk after he or she is finished.  Wear a snug bra (nursing or regular) or tank top for 1-2 days to signal your body to slightly decrease milk production.  Apply ice packs to your breasts, unless this is too uncomfortable for you.  Make sure that your baby is latched on and positioned properly while breastfeeding.  If engorgement persists after 48 hours of following these recommendations, contact your health care provider or a lactation consultant. Overall health care recommendations while breastfeeding  Eat healthy foods. Alternate between meals and snacks, eating 3 of each per  day. Because what you eat affects your breast milk, some of the foods may make your baby more irritable than usual. Avoid eating these foods if you are sure that they are negatively affecting your baby.  Drink milk, fruit juice, and water to satisfy your thirst (about 10 glasses a day).  Rest often, relax, and continue to take your prenatal vitamins to prevent fatigue, stress, and anemia.  Continue breast self-awareness checks.  Avoid chewing and smoking tobacco. Chemicals from cigarettes that pass into breast milk and exposure to secondhand smoke may harm your baby.  Avoid alcohol and drug use, including marijuana. Some medicines that may be harmful to your baby can pass through breast milk. It is important to ask your health care   provider before taking any medicine, including all over-the-counter and prescription medicine as well as vitamin and herbal supplements. It is possible to become pregnant while breastfeeding. If birth control is desired, ask your health care provider about options that will be safe for your baby. Contact a health care provider if:  You feel like you want to stop breastfeeding or have become frustrated with breastfeeding.  You have painful breasts or nipples.  Your nipples are cracked or bleeding.  Your breasts are red, tender, or warm.  You have a swollen area on either breast.  You have a fever or chills.  You have nausea or vomiting.  You have drainage other than breast milk from your nipples.  Your breasts do not become full before feedings by the fifth day after you give birth.  You feel sad and depressed.  Your baby is too sleepy to eat well.  Your baby is having trouble sleeping.  Your baby is wetting less than 3 diapers in a 24-hour period.  Your baby has less than 3 stools in a 24-hour period.  Your baby's skin or the white part of his or her eyes becomes yellow.  Your baby is not gaining weight by 5 days of age. Get help right away  if:  Your baby is overly tired (lethargic) and does not want to wake up and feed.  Your baby develops an unexplained fever. This information is not intended to replace advice given to you by your health care provider. Make sure you discuss any questions you have with your health care provider. Document Released: 03/12/2005 Document Revised: 08/24/2015 Document Reviewed: 09/03/2012 Elsevier Interactive Patient Education  2017 [employer] Inc[employer]  

## 2016-07-16 NOTE — Progress Notes (Signed)
    PRENATAL VISIT NOTE  Subjective:  Andrea Romero is a 26 y.o. G3P0020 at [redacted]w[redacted]d being seen today for ongoing prenatal care.  She is currently monitored for the following issues for this high-risk pregnancy and has Supervision of high-risk pregnancy; Monochorionic diamniotic twin pregnancy; and IUGR (intrauterine growth restriction) baby A affecting care of mother, third trimester, fetus 1 on her problem list.  Patient reports no complaints.  Contractions: Not present. Vag. Bleeding: None.  Movement: Present. Denies leaking of fluid.   The following portions of the patient's history were reviewed and updated as appropriate: allergies, current medications, past family history, past medical history, past social history, past surgical history and problem list. Problem list updated.  Objective:   Vitals:   07/16/16 0813  BP: 125/84  Pulse: 95  Weight: 138 lb 14.4 oz (63 kg)    Fetal Status: Fetal Heart Rate (bpm): 144/146 Fundal Height: 35 cm Movement: Present     General:  Alert, oriented and cooperative. Patient is in no acute distress.  Skin: Skin is warm and dry. No rash noted.   Cardiovascular: Normal heart rate noted  Respiratory: Normal respiratory effort, no problems with respiration noted  Abdomen: Soft, gravid, appropriate for gestational age. Pain/Pressure: Absent     Pelvic:  Cervical exam deferred        Extremities: Normal range of motion.  Edema: None  Mental Status: Normal mood and affect. Normal behavior. Normal judgment and thought content.  U/S for growth 4/19 A, vtx, nml fluid, 1 lb 9 oz (<10%) AC < 3%, BPP 6/8 B, vtx, nml fluid, 2 lb 8 oz (56%), BPP 6/8 39% discrepancy Assessment and Plan:  Pregnancy: G3P0020 at [redacted]w[redacted]d  1. Supervision of high risk pregnancy, antepartum 28 wk labs today - Glucose Tolerance, 2 Hours w/1 Hour - CBC - RPR - HIV antibody (with reflex) - Tdap (BOOSTRIX) injection 0.5 mL; Inject 0.5 mLs into the muscle once.  2. Monochorionic  diamniotic twin pregnancy, antepartum For weekly checks and q 2 wks growth   3. IUGR (intrauterine growth restriction) affecting care of mother, third trimester, fetus 1 BPP scheduled for Wednesday 4/25  Preterm labor symptoms and general obstetric precautions including but not limited to vaginal bleeding, contractions, leaking of fluid and fetal movement were reviewed in detail with the patient. Please refer to After Visit Summary for other counseling recommendations.  Return in 2 weeks (on 07/30/2016).   Reva Bores, MD

## 2016-07-17 ENCOUNTER — Encounter: Payer: Self-pay | Admitting: Family Medicine

## 2016-07-17 ENCOUNTER — Telehealth: Payer: Self-pay | Admitting: *Deleted

## 2016-07-17 DIAGNOSIS — O24419 Gestational diabetes mellitus in pregnancy, unspecified control: Secondary | ICD-10-CM | POA: Insufficient documentation

## 2016-07-17 LAB — CBC
HEMOGLOBIN: 12 g/dL (ref 11.1–15.9)
Hematocrit: 35.4 % (ref 34.0–46.6)
MCH: 30.6 pg (ref 26.6–33.0)
MCHC: 33.9 g/dL (ref 31.5–35.7)
MCV: 90 fL (ref 79–97)
PLATELETS: 228 10*3/uL (ref 150–379)
RBC: 3.92 x10E6/uL (ref 3.77–5.28)
RDW: 13 % (ref 12.3–15.4)
WBC: 8 10*3/uL (ref 3.4–10.8)

## 2016-07-17 LAB — HIV ANTIBODY (ROUTINE TESTING W REFLEX): HIV SCREEN 4TH GENERATION: NONREACTIVE

## 2016-07-17 LAB — GLUCOSE TOLERANCE, 2 HOURS W/ 1HR
GLUCOSE, 1 HOUR: 188 mg/dL — AB (ref 65–179)
Glucose, 2 hour: 165 mg/dL — ABNORMAL HIGH (ref 65–152)
Glucose, Fasting: 92 mg/dL — ABNORMAL HIGH (ref 65–91)

## 2016-07-17 LAB — RPR: RPR: NONREACTIVE

## 2016-07-17 NOTE — Telephone Encounter (Addendum)
-----   Message from Reva Bores, MD sent at 07/17/2016  8:28 AM EDT ----- Abnl 2 hour--please refer to diabetes and nutrition asap  Called pt and informed her of 2hr GTT results. She agreed to appt on 4/30 @ 1000 for nutrition and CBG testing education. Pt was advised to stop eating desserts, sodas and foods with refined sugar between now and her appt on 4/30.  She voiced understanding of all information and instructions given.

## 2016-07-18 ENCOUNTER — Ambulatory Visit (HOSPITAL_COMMUNITY)
Admission: RE | Admit: 2016-07-18 | Discharge: 2016-07-18 | Disposition: A | Discharge status: Home / Self Care | Payer: Medicaid Other | Source: Ambulatory Visit | Attending: Obstetrics and Gynecology | Admitting: Obstetrics and Gynecology

## 2016-07-18 ENCOUNTER — Encounter (HOSPITAL_COMMUNITY): Payer: Self-pay

## 2016-07-18 DIAGNOSIS — Z3A28 28 weeks gestation of pregnancy: Secondary | ICD-10-CM | POA: Insufficient documentation

## 2016-07-18 DIAGNOSIS — O365931 Maternal care for other known or suspected poor fetal growth, third trimester, fetus 1: Secondary | ICD-10-CM | POA: Diagnosis not present

## 2016-07-18 DIAGNOSIS — Z362 Encounter for other antenatal screening follow-up: Secondary | ICD-10-CM | POA: Insufficient documentation

## 2016-07-18 DIAGNOSIS — O30033 Twin pregnancy, monochorionic/diamniotic, third trimester: Secondary | ICD-10-CM | POA: Diagnosis not present

## 2016-07-18 NOTE — Consult Note (Signed)
[facility]  Prenatal Consult       07/18/2016  2:50 PM   I was asked by Dr. Sherrie George to consult on this patient for possible preterm delivery.  I had the pleasure of meeting with Ms. Briones and her fiance today.  She is a 26 y/o G3P0020 at 51 and 5/[redacted] weeks gestation with mo-di female twins.  There is a 39% weight discrepancy and there has been some absent end diastolic in Baby A, though per Ms. Muralles this had improved at the last ultrasound.    I explained that the neonatal intensive care team would be present for the delivery and outlined the likely delivery room course for this baby including routine resuscitation and NRP-guided approaches to the treatment of respiratory distress. We discussed other common problems associated with prematurity including respiratory distress syndrome/CLD, apnea, feeding issues, temperature regulation, and infection risk.  We briefly discussed IVH/PVL and ROP and that these are complications associated with prematurity, but that by 30 weeks are uncommon.   We discussed the average length of stay but I noted that the actual LOS would depend on the severity of problems encountered and response to treatments.  We discussed visitation policies and the resources available while her infants are in the hospital.  We discussed the importance of good nutrition and various methods of providing nutrition (parenteral hyperalimentation, gavage feedings and/or oral feeding). We discussed the benefits of human milk. I encouraged breast feeding and pumping soon after birth and outlined resources that are available to support breast feeding. She does intend on breastfeeding her infant.    Thank you for involving [place] in the care of this patient. A member of our team will be available should the family have additional questions.  Time for consultation approximately 45 minutes.   _____________________ Electronically Signed By: Maryan Char, MD Neonatologist

## 2016-07-23 ENCOUNTER — Encounter: Payer: Medicaid Other | Attending: Obstetrics & Gynecology | Admitting: *Deleted

## 2016-07-23 ENCOUNTER — Ambulatory Visit: Payer: Medicaid Other | Admitting: *Deleted

## 2016-07-23 ENCOUNTER — Other Ambulatory Visit: Payer: Self-pay

## 2016-07-23 DIAGNOSIS — Z713 Dietary counseling and surveillance: Secondary | ICD-10-CM | POA: Diagnosis present

## 2016-07-23 DIAGNOSIS — O24410 Gestational diabetes mellitus in pregnancy, diet controlled: Secondary | ICD-10-CM

## 2016-07-23 DIAGNOSIS — O24419 Gestational diabetes mellitus in pregnancy, unspecified control: Secondary | ICD-10-CM | POA: Insufficient documentation

## 2016-07-23 MED ORDER — ACCU-CHEK GUIDE W/DEVICE KIT: 1.0000 | PACK | Freq: Four times a day (QID) | 0 refills | Status: DC

## 2016-07-23 MED ORDER — GLUCOSE BLOOD VI STRP: ORAL_STRIP | 12 refills | Status: DC

## 2016-07-23 MED ORDER — ACCU-CHEK FASTCLIX LANCETS MISC: 1.0000 | Freq: Four times a day (QID) | 12 refills | Status: DC

## 2016-07-23 NOTE — Progress Notes (Signed)
  Patient was seen on 07/23/2016 for Gestational Diabetes self-management. Patient is here with her fiancee, Christin, who listened throughout the visit. Patient states she is pregnant with twins. She recently moved here from [place] to be with her fiancee.  The following learning objectives were met by the patient :   States the definition of Gestational Diabetes  States why dietary management is important in controlling blood glucose  Describes the effects of carbohydrates on blood glucose levels  Demonstrates ability to create a balanced meal plan  Demonstrates carbohydrate counting   States when to check blood glucose levels  Demonstrates proper blood glucose monitoring techniques  States the effect of stress and exercise on blood glucose levels  States the importance of limiting caffeine and abstaining from alcohol and smoking  Plan:  Aim for 3 Carb Choices per meal (45 grams) +/- 1 either way  Aim for 1-2 Carbs per snack Begin reading food labels for Total Carbohydrate of foods Consider  increasing your activity level by walking or other activity daily as tolerated Begin checking BG before breakfast and 2 hours after first bite of breakfast, lunch and dinner as directed by MD  Bring Log Book to every medical appointment   Take medication if directed by MD  Blood glucose monitor Rx called into pharmacy: Accu Chek Guide  Patient instructed to test pre breakfast and 2 hours each meal as directed by MD   Patient instructed to monitor glucose levels: FBS: 60 - <90 2 hour: <120  Patient received the following handouts:  Nutrition Diabetes and Pregnancy  Carbohydrate Counting List  Patient will be seen for follow-up as needed.

## 2016-07-25 ENCOUNTER — Other Ambulatory Visit (HOSPITAL_COMMUNITY): Payer: Self-pay | Admitting: Maternal and Fetal Medicine

## 2016-07-25 ENCOUNTER — Ambulatory Visit (HOSPITAL_COMMUNITY)
Admission: RE | Admit: 2016-07-25 | Discharge: 2016-07-25 | Disposition: A | Discharge status: Home / Self Care | Payer: Medicaid Other | Source: Ambulatory Visit | Attending: Obstetrics and Gynecology | Admitting: Obstetrics and Gynecology

## 2016-07-25 ENCOUNTER — Encounter (HOSPITAL_COMMUNITY): Payer: Self-pay

## 2016-07-25 DIAGNOSIS — O365931 Maternal care for other known or suspected poor fetal growth, third trimester, fetus 1: Secondary | ICD-10-CM

## 2016-07-25 DIAGNOSIS — O30033 Twin pregnancy, monochorionic/diamniotic, third trimester: Secondary | ICD-10-CM | POA: Diagnosis present

## 2016-07-25 DIAGNOSIS — Z3A29 29 weeks gestation of pregnancy: Secondary | ICD-10-CM | POA: Diagnosis present

## 2016-07-26 ENCOUNTER — Telehealth: Payer: Self-pay | Admitting: General Practice

## 2016-07-26 ENCOUNTER — Other Ambulatory Visit (HOSPITAL_COMMUNITY): Payer: Self-pay | Admitting: *Deleted

## 2016-07-26 DIAGNOSIS — O365931 Maternal care for other known or suspected poor fetal growth, third trimester, fetus 1: Secondary | ICD-10-CM

## 2016-07-26 DIAGNOSIS — O24410 Gestational diabetes mellitus in pregnancy, diet controlled: Secondary | ICD-10-CM

## 2016-07-26 MED ORDER — GLUCOSE BLOOD VI STRP: ORAL_STRIP | 12 refills | Status: DC

## 2016-07-26 NOTE — Telephone Encounter (Signed)
Patient called and left message stating she still hasn't received all her testing supplies because the pharmacy hasn't received all her information. Patient states she and the pharmacy have been trying to contact us all week. Called pharmacy and provided updated Rx with correct information. Called patient, no answer- left message stating we are trying to reach you to return your phone call. We have corrected the prescription and you may go by to pick it up today. Please call us back if you have questions or concerns

## 2016-07-30 ENCOUNTER — Other Ambulatory Visit (HOSPITAL_COMMUNITY)
Admission: RE | Admit: 2016-07-30 | Discharge: 2016-07-30 | Disposition: A | Discharge status: Home / Self Care | Payer: Medicaid Other | Source: Ambulatory Visit | Attending: Family Medicine | Admitting: Family Medicine

## 2016-07-30 ENCOUNTER — Ambulatory Visit (INDEPENDENT_AMBULATORY_CARE_PROVIDER_SITE_OTHER): Payer: Medicaid Other | Admitting: Family Medicine

## 2016-07-30 VITALS — BP 114/85 | HR 95 | Wt 141.0 lb

## 2016-07-30 DIAGNOSIS — Z331 Pregnant state, incidental: Secondary | ICD-10-CM | POA: Diagnosis not present

## 2016-07-30 DIAGNOSIS — O0993 Supervision of high risk pregnancy, unspecified, third trimester: Secondary | ICD-10-CM

## 2016-07-30 DIAGNOSIS — O30033 Twin pregnancy, monochorionic/diamniotic, third trimester: Secondary | ICD-10-CM | POA: Diagnosis not present

## 2016-07-30 DIAGNOSIS — O24419 Gestational diabetes mellitus in pregnancy, unspecified control: Secondary | ICD-10-CM | POA: Diagnosis not present

## 2016-07-30 DIAGNOSIS — N898 Other specified noninflammatory disorders of vagina: Secondary | ICD-10-CM | POA: Insufficient documentation

## 2016-07-30 DIAGNOSIS — O365931 Maternal care for other known or suspected poor fetal growth, third trimester, fetus 1: Secondary | ICD-10-CM

## 2016-07-30 DIAGNOSIS — O26879 Cervical shortening, unspecified trimester: Secondary | ICD-10-CM | POA: Insufficient documentation

## 2016-07-30 DIAGNOSIS — O26873 Cervical shortening, third trimester: Secondary | ICD-10-CM | POA: Diagnosis not present

## 2016-07-30 DIAGNOSIS — O0990 Supervision of high risk pregnancy, unspecified, unspecified trimester: Secondary | ICD-10-CM

## 2016-07-30 MED ORDER — TERCONAZOLE 0.8 % VA CREA: 1.0000 | TOPICAL_CREAM | Freq: Every day | VAGINAL | 0 refills | Status: DC

## 2016-07-30 NOTE — Progress Notes (Signed)
Pt c/o vaginal itching/irritation Itchy abdomen

## 2016-07-30 NOTE — Progress Notes (Signed)
    PRENATAL VISIT NOTE  Subjective:  Andrea Romero is a 26 y.o. G3P0020 at 6872w3d being seen today for ongoing prenatal care.  She is currently monitored for the following issues for this high-risk pregnancy and has Supervision of high-risk pregnancy; Monochorionic diamniotic twin pregnancy; IUGR (intrauterine growth restriction) baby A affecting care of mother, third trimester, fetus 1; Gestational diabetes mellitus (GDM) affecting pregnancy; and Cervical shortening affecting pregnancy, antepartum on her problem list.  Patient reports vaginal irritation and vagianl discharge.  Contractions: Not present. Vag. Bleeding: None.  Movement: Present. Denies leaking of fluid.   The following portions of the patient's history were reviewed and updated as appropriate: allergies, current medications, past family history, past medical history, past social history, past surgical history and problem list. Problem list updated.  Objective:   Vitals:   07/30/16 1054  BP: 114/85  Pulse: 95  Weight: 141 lb (64 kg)    Fetal Status: Fetal Heart Rate (bpm): 141/135 Fundal Height: 37 cm Movement: Present     General:  Alert, oriented and cooperative. Patient is in no acute distress.  Skin: Skin is warm and dry. No rash noted.   Cardiovascular: Normal heart rate noted  Respiratory: Normal respiratory effort, no problems with respiration noted  Abdomen: Soft, gravid, appropriate for gestational age. Pain/Pressure: Present     Pelvic:  Cervical exam deferred        Extremities: Normal range of motion.  Edema: None  Mental Status: Normal mood and affect. Normal behavior. Normal judgment and thought content.  FBS 82-104 2 hour pp 106-132 Assessment and Plan:  Pregnancy: G3P0020 at 372w3d  1. Supervision of high risk pregnancy, antepartum Continue prenatal care.   2. Monochorionic diamniotic twin gestation in third trimester Getting weekly BPPs with MFM Consider adding 2x/wk NST's at 32 wks  3.  Gestational diabetes mellitus (GDM) affecting pregnancy Currently diet controlled--improve diet, add protein and exercise  4. IUGR (intrauterine growth restriction) baby A affecting care of mother, third trimester, fetus 1 Weekly testing and Dopplers, per pt, perhaps 35 wk deliver  5. Cervical shortening affecting pregnancy, antepartum Has pessary in place  6. Vaginal discharge Check wet prep Treat presumptively for yeast - Cervicovaginal ancillary only - terconazole (TERAZOL 3) 0.8 % vaginal cream; Place 1 applicator vaginally at bedtime.  Dispense: 20 g; Refill: 0  Preterm labor symptoms and general obstetric precautions including but not limited to vaginal bleeding, contractions, leaking of fluid and fetal movement were reviewed in detail with the patient. Please refer to After Visit Summary for other counseling recommendations.  Return in 2 weeks (on 08/13/2016) for OB visit and NST x 2.   Reva BoresPratt, Vipul Cafarelli S, MD

## 2016-07-30 NOTE — Patient Instructions (Signed)
Third Trimester of Pregnancy The third trimester is from week 28 through week 40 (months 7 through 9). The third trimester is a time when the unborn baby (fetus) is growing rapidly. At the end of the ninth month, the fetus is about 20 inches in length and weighs 6-10 pounds. Body changes during your third trimester Your body will continue to go through many changes during pregnancy. The changes vary from woman to woman. During the third trimester:  Your weight will continue to increase. You can expect to gain 25-35 pounds (11-16 kg) by the end of the pregnancy.  You may begin to get stretch marks on your hips, abdomen, and breasts.  You may urinate more often because the fetus is moving lower into your pelvis and pressing on your bladder.  You may develop or continue to have heartburn. This is caused by increased hormones that slow down muscles in the digestive tract.  You may develop or continue to have constipation because increased hormones slow digestion and cause the muscles that push waste through your intestines to relax.  You may develop hemorrhoids. These are swollen veins (varicose veins) in the rectum that can itch or be painful.  You may develop swollen, bulging veins (varicose veins) in your legs.  You may have increased body aches in the pelvis, back, or thighs. This is due to weight gain and increased hormones that are relaxing your joints.  You may have changes in your hair. These can include thickening of your hair, rapid growth, and changes in texture. Some women also have hair loss during or after pregnancy, or hair that feels dry or thin. Your hair will most likely return to normal after your baby is born.  Your breasts will continue to grow and they will continue to become tender. A yellow fluid (colostrum) may leak from your breasts. This is the first milk you are producing for your baby.  Your belly button may stick out.  You may notice more swelling in your hands,  face, or ankles.  You may have increased tingling or numbness in your hands, arms, and legs. The skin on your belly may also feel numb.  You may feel short of breath because of your expanding uterus.  You may have more problems sleeping. This can be caused by the size of your belly, increased need to urinate, and an increase in your body's metabolism.  You may notice the fetus "dropping," or moving lower in your abdomen (lightening).  You may have increased vaginal discharge.  You may notice your joints feel loose and you may have pain around your pelvic bone.  What to expect at prenatal visits You will have prenatal exams every 2 weeks until week 36. Then you will have weekly prenatal exams. During a routine prenatal visit:  You will be weighed to make sure you and the baby are growing normally.  Your blood pressure will be taken.  Your abdomen will be measured to track your baby's growth.  The fetal heartbeat will be listened to.  Any test results from the previous visit will be discussed.  You may have a cervical check near your due date to see if your cervix has softened or thinned (effaced).  You will be tested for Group B streptococcus. This happens between 35 and 37 weeks.  Your health care provider may ask you:  What your birth plan is.  How you are feeling.  If you are feeling the baby move.  If you have had   any abnormal symptoms, such as leaking fluid, bleeding, severe headaches, or abdominal cramping.  If you are using any tobacco products, including cigarettes, chewing tobacco, and electronic cigarettes.  If you have any questions.  Other tests or screenings that may be performed during your third trimester include:  Blood tests that check for low iron levels (anemia).  Fetal testing to check the health, activity level, and growth of the fetus. Testing is done if you have certain medical conditions or if there are problems during the  pregnancy.  Nonstress test (NST). This test checks the health of your baby to make sure there are no signs of problems, such as the baby not getting enough oxygen. During this test, a belt is placed around your belly. The baby is made to move, and its heart rate is monitored during movement.  What is false labor? False labor is a condition in which you feel small, irregular tightenings of the muscles in the womb (contractions) that usually go away with rest, changing position, or drinking water. These are called Braxton Hicks contractions. Contractions may last for hours, days, or even weeks before true labor sets in. If contractions come at regular intervals, become more frequent, increase in intensity, or become painful, you should see your health care provider. What are the signs of labor?  Abdominal cramps.  Regular contractions that start at 10 minutes apart and become stronger and more frequent with time.  Contractions that start on the top of the uterus and spread down to the lower abdomen and back.  Increased pelvic pressure and dull back pain.  A watery or bloody mucus discharge that comes from the vagina.  Leaking of amniotic fluid. This is also known as your "water breaking." It could be a slow trickle or a gush. Let your health care provider know if it has a color or strange odor. If you have any of these signs, call your health care provider right away, even if it is before your due date. Follow these instructions at home: Medicines  Follow your health care provider's instructions regarding medicine use. Specific medicines may be either safe or unsafe to take during pregnancy.  Take a prenatal vitamin that contains at least 600 micrograms (mcg) of folic acid.  If you develop constipation, try taking a stool softener if your health care provider approves. Eating and drinking  Eat a balanced diet that includes fresh fruits and vegetables, whole grains, good sources of protein  such as meat, eggs, or tofu, and low-fat dairy. Your health care provider will help you determine the amount of weight gain that is right for you.  Avoid raw meat and uncooked cheese. These carry germs that can cause birth defects in the baby.  If you have low calcium intake from food, talk to your health care provider about whether you should take a daily calcium supplement.  Eat four or five small meals rather than three large meals a day.  Limit foods that are high in fat and processed sugars, such as fried and sweet foods.  To prevent constipation: ? Drink enough fluid to keep your urine clear or pale yellow. ? Eat foods that are high in fiber, such as fresh fruits and vegetables, whole grains, and beans. Activity  Exercise only as directed by your health care provider. Most women can continue their usual exercise routine during pregnancy. Try to exercise for 30 minutes at least 5 days a week. Stop exercising if you experience uterine contractions.  Avoid heavy   lifting.  Do not exercise in extreme heat or humidity, or at high altitudes.  Wear low-heel, comfortable shoes.  Practice good posture.  You may continue to have sex unless your health care provider tells you otherwise. Relieving pain and discomfort  Take frequent breaks and rest with your legs elevated if you have leg cramps or low back pain.  Take warm sitz baths to soothe any pain or discomfort caused by hemorrhoids. Use hemorrhoid cream if your health care provider approves.  Wear a good support bra to prevent discomfort from breast tenderness.  If you develop varicose veins: ? Wear support pantyhose or compression stockings as told by your healthcare provider. ? Elevate your feet for 15 minutes, 3-4 times a day. Prenatal care  Write down your questions. Take them to your prenatal visits.  Keep all your prenatal visits as told by your health care provider. This is important. Safety  Wear your seat belt at  all times when driving.  Make a list of emergency phone numbers, including numbers for family, friends, the hospital, and police and fire departments. General instructions  Avoid cat litter boxes and soil used by cats. These carry germs that can cause birth defects in the baby. If you have a cat, ask someone to clean the litter box for you.  Do not travel far distances unless it is absolutely necessary and only with the approval of your health care provider.  Do not use hot tubs, steam rooms, or saunas.  Do not drink alcohol.  Do not use any products that contain nicotine or tobacco, such as cigarettes and e-cigarettes. If you need help quitting, ask your health care provider.  Do not use any medicinal herbs or unprescribed drugs. These chemicals affect the formation and growth of the baby.  Do not douche or use tampons or scented sanitary pads.  Do not cross your legs for long periods of time.  To prepare for the arrival of your baby: ? Take prenatal classes to understand, practice, and ask questions about labor and delivery. ? Make a trial run to the hospital. ? Visit the hospital and tour the maternity area. ? Arrange for maternity or paternity leave through employers. ? Arrange for family and friends to take care of pets while you are in the hospital. ? Purchase a rear-facing car seat and make sure you know how to install it in your car. ? Pack your hospital bag. ? Prepare the baby's nursery. Make sure to remove all pillows and stuffed animals from the baby's crib to prevent suffocation.  Visit your dentist if you have not gone during your pregnancy. Use a soft toothbrush to brush your teeth and be gentle when you floss. Contact a health care provider if:  You are unsure if you are in labor or if your water has broken.  You become dizzy.  You have mild pelvic cramps, pelvic pressure, or nagging pain in your abdominal area.  You have lower back pain.  You have persistent  nausea, vomiting, or diarrhea.  You have an unusual or bad smelling vaginal discharge.  You have pain when you urinate. Get help right away if:  Your water breaks before 37 weeks.  You have regular contractions less than 5 minutes apart before 37 weeks.  You have a fever.  You are leaking fluid from your vagina.  You have spotting or bleeding from your vagina.  You have severe abdominal pain or cramping.  You have rapid weight loss or weight gain.    You have shortness of breath with chest pain.  You notice sudden or extreme swelling of your face, hands, ankles, feet, or legs.  Your baby makes fewer than 10 movements in 2 hours.  You have severe headaches that do not go away when you take medicine.  You have vision changes. Summary  The third trimester is from week 28 through week 40, months 7 through 9. The third trimester is a time when the unborn baby (fetus) is growing rapidly.  During the third trimester, your discomfort may increase as you and your baby continue to gain weight. You may have abdominal, leg, and back pain, sleeping problems, and an increased need to urinate.  During the third trimester your breasts will keep growing and they will continue to become tender. A yellow fluid (colostrum) may leak from your breasts. This is the first milk you are producing for your baby.  False labor is a condition in which you feel small, irregular tightenings of the muscles in the womb (contractions) that eventually go away. These are called Braxton Hicks contractions. Contractions may last for hours, days, or even weeks before true labor sets in.  Signs of labor can include: abdominal cramps; regular contractions that start at 10 minutes apart and become stronger and more frequent with time; watery or bloody mucus discharge that comes from the vagina; increased pelvic pressure and dull back pain; and leaking of amniotic fluid. This information is not intended to replace advice  given to you by your health care provider. Make sure you discuss any questions you have with your health care provider. Document Released: 03/06/2001 Document Revised: 08/18/2015 Document Reviewed: 05/13/2012 Elsevier Interactive Patient Education  2017 [employer]  

## 2016-07-31 LAB — CERVICOVAGINAL ANCILLARY ONLY
BACTERIAL VAGINITIS: NEGATIVE
CANDIDA VAGINITIS: NEGATIVE
TRICH (WINDOWPATH): NEGATIVE

## 2016-08-01 ENCOUNTER — Inpatient Hospital Stay (HOSPITAL_COMMUNITY)
Admission: AD | Admit: 2016-08-01 | Discharge: 2016-08-01 | Disposition: A | Discharge status: Home / Self Care | Payer: Medicaid Other | Source: Ambulatory Visit | Attending: Family Medicine | Admitting: Family Medicine

## 2016-08-01 ENCOUNTER — Other Ambulatory Visit (HOSPITAL_COMMUNITY): Payer: Self-pay | Admitting: Maternal and Fetal Medicine

## 2016-08-01 ENCOUNTER — Encounter (HOSPITAL_COMMUNITY): Payer: Self-pay

## 2016-08-01 ENCOUNTER — Ambulatory Visit (HOSPITAL_COMMUNITY)
Admission: RE | Admit: 2016-08-01 | Discharge: 2016-08-01 | Disposition: A | Discharge status: Home / Self Care | Payer: Medicaid Other | Source: Ambulatory Visit | Attending: Obstetrics and Gynecology | Admitting: Obstetrics and Gynecology

## 2016-08-01 DIAGNOSIS — O30033 Twin pregnancy, monochorionic/diamniotic, third trimester: Secondary | ICD-10-CM | POA: Insufficient documentation

## 2016-08-01 DIAGNOSIS — O9989 Other specified diseases and conditions complicating pregnancy, childbirth and the puerperium: Secondary | ICD-10-CM

## 2016-08-01 DIAGNOSIS — Z3689 Encounter for other specified antenatal screening: Secondary | ICD-10-CM

## 2016-08-01 DIAGNOSIS — Z3A30 30 weeks gestation of pregnancy: Secondary | ICD-10-CM | POA: Diagnosis not present

## 2016-08-01 DIAGNOSIS — O365931 Maternal care for other known or suspected poor fetal growth, third trimester, fetus 1: Secondary | ICD-10-CM

## 2016-08-01 DIAGNOSIS — Z7982 Long term (current) use of aspirin: Secondary | ICD-10-CM | POA: Diagnosis not present

## 2016-08-01 NOTE — MAU Provider Note (Signed)
History     CSN: [id number]  Arrival date and time: 08/01/16 1445   First Provider Initiated Contact with Patient 08/01/16 1623      Chief Complaint  Patient presents with  . for monitoring   HPI   Andrea Romero is a 26 y.o. female G90P0020 @ 29w5dhere in MAU for an NST. She was seen in MFM today for her weekly BPP. Baby A scored 8/8, and Baby B scored 6/8 (-2 off for breathing).  + fetal movement X 2 per the patient. Denies vaginal bleeding or leaking of fluid.   OB History    Gravida Para Term Preterm AB Living   3       2     SAB TAB Ectopic Multiple Live Births   2              Past Medical History:  Diagnosis Date  . Medical history non-contributory     Past Surgical History:  Procedure Laterality Date  . NO PAST SURGERIES      No family history on file.  Social History  Substance Use Topics  . Smoking status: Never Smoker  . Smokeless tobacco: Never Used  . Alcohol use No    Allergies: No Known Allergies  Prescriptions Prior to Admission  Medication Sig Dispense Refill Last Dose  . aspirin EC 81 MG tablet Take 81 mg by mouth at bedtime.   07/31/2016 at Unknown time  . IRON PO Take 1 tablet by mouth at bedtime.    07/31/2016 at Unknown time  . Prenatal Vit-Fe Fumarate-FA (PRENATAL VITAMIN PO) Take 1 tablet by mouth at bedtime.    07/31/2016 at Unknown time  . ACCU-CHEK FASTCLIX LANCETS MISC 1 each by Percutaneous route 4 (four) times daily. 100 each 12 Taking  . Blood Glucose Monitoring Suppl (ACCU-CHEK GUIDE) w/Device KIT 1 kit by Does not apply route 4 (four) times daily. 1 kit 0 Taking  . glucose blood (ACCU-CHEK GUIDE) test strip Use 1 strip 4 times daily dx o24.419 gestational diabetes 100 each 12 Taking  . terconazole (TERAZOL 3) 0.8 % vaginal cream Place 1 applicator vaginally at bedtime. (Patient not taking: Reported on 08/01/2016) 20 g 0 Not Taking at Unknown time   No results found for this or any previous visit (from the past 4105hour(s)).  Review of  Systems  Gastrointestinal: Negative for abdominal pain.  Genitourinary: Negative for vaginal bleeding.   Physical Exam   Blood pressure 122/75, pulse 85, temperature 98 F (36.7 C), resp. rate 16, last menstrual period 12/30/2015.  Physical Exam  Constitutional: She is oriented to person, place, and time. She appears well-developed and well-nourished. No distress.  HENT:  Head: Normocephalic.  Respiratory: Effort normal.  GI: Soft. She exhibits no distension. There is no tenderness.  Musculoskeletal: Normal range of motion.  Neurological: She is alert and oriented to person, place, and time.  Skin: Skin is warm. She is not diaphoretic.  Psychiatric: Her behavior is normal.   Fetal Tracing A: Baseline: 135 bmp Variability: moderate  Accelerations: 15x15 Decelerations: none Toco: none  Fetal Tracing B: Baseline: 125 bpm   Variability: Moderate  Accelerations: 15x15 Decelerations: None Toco: None  MAU Course  Procedures None  MDM  NST Fetal tracing with 15x15 accels, discussed tracing with Dr. NErnestina Patches 8/10 BPP now with reactive NST. Absent breathing at this gestation not as concerning with reactive NST.    Assessment and Plan   A:  1. NST (non-stress test) reactive  2. Monochorionic diamniotic twin gestation in third trimester     P:  Discharge home in stable condition Keep your office appointment for Friday 5/11 Return to MAU as needed, if symptoms worsen   Andrea Romero, Andrea Pais, NP 08/02/2016 9:09 PM

## 2016-08-01 NOTE — Discharge Instructions (Signed)
Nonstress Test The nonstress test is a procedure that monitors the fetus's heartbeat. The test will monitor the heartbeat when the fetus is at rest and while the fetus is moving. In a healthy fetus, there will be an increase in fetal heart rate when the fetus moves or kicks. The heart rate will decrease at rest. This test helps determine if the fetus is healthy. Your health care provider will look at a number of patterns in the heart rate tracing to make sure your baby is thriving. If there is concern, your health care provider may order additional tests or may suggest another course of action. This test is often done in the third trimester and can help determine if an early delivery is needed and safe. Common reasons to have this test are:  You are past your due date.  You have a high-risk pregnancy.  You are feeling less movement than normal.  You have lost a pregnancy in the past.  Your health care provider suspects fetal growth problems.  You have too much or too little amniotic fluid. What happens before the procedure?  Eat a meal right before the test or as directed by your health care provider. Food may help stimulate fetal movements.  Use the restroom right before the test. What happens during the procedure?  Two belts will be placed around your abdomen. These belts have monitors attached to them. One records the fetal heart rate and the other records uterine contractions.  You may be asked to lie down on your side or to stay sitting upright.  You may be given a button to press when you feel movement.  The fetal heartbeat is listened to and watched on a screen. The heartbeat is recorded on a sheet of paper.  If the fetus seems to be sleeping, you may be asked to drink some juice or soda, gently press your abdomen, or make some noise to wake the fetus. What happens after the procedure? Your health care provider will discuss the test results with you and make recommendations for  the near future.  This information is not intended to replace advice given to you by your health care provider. Make sure you discuss any questions you have with your health care provider. This information is not intended to replace advice given to you by your health care provider. Make sure you discuss any questions you have with your health care provider. Document Released: 03/02/2002 Document Revised: 02/10/2016 Document Reviewed: 04/15/2012 Elsevier Interactive Patient Education  2017 [employer] Inc[employer]  

## 2016-08-01 NOTE — MAU Note (Signed)
Sent from MFM, mono/di twins.  Twin A IUGR; twin B's BBP was 6/8- for breathing today. Sent over for further monitoring.

## 2016-08-03 ENCOUNTER — Ambulatory Visit (INDEPENDENT_AMBULATORY_CARE_PROVIDER_SITE_OTHER): Payer: Medicaid Other | Admitting: Obstetrics & Gynecology

## 2016-08-03 VITALS — BP 121/85 | HR 96 | Wt 142.5 lb

## 2016-08-03 DIAGNOSIS — O365931 Maternal care for other known or suspected poor fetal growth, third trimester, fetus 1: Secondary | ICD-10-CM

## 2016-08-03 DIAGNOSIS — O30033 Twin pregnancy, monochorionic/diamniotic, third trimester: Secondary | ICD-10-CM | POA: Diagnosis not present

## 2016-08-03 DIAGNOSIS — O365930 Maternal care for other known or suspected poor fetal growth, third trimester, not applicable or unspecified: Secondary | ICD-10-CM | POA: Diagnosis not present

## 2016-08-03 DIAGNOSIS — O0993 Supervision of high risk pregnancy, unspecified, third trimester: Secondary | ICD-10-CM | POA: Diagnosis not present

## 2016-08-03 DIAGNOSIS — O26873 Cervical shortening, third trimester: Secondary | ICD-10-CM | POA: Diagnosis not present

## 2016-08-03 DIAGNOSIS — O26879 Cervical shortening, unspecified trimester: Secondary | ICD-10-CM

## 2016-08-03 DIAGNOSIS — O24419 Gestational diabetes mellitus in pregnancy, unspecified control: Secondary | ICD-10-CM

## 2016-08-03 NOTE — Progress Notes (Signed)
   PRENATAL VISIT NOTE  Subjective:  Andrea Romero is a 26 y.o. G3P0020 at 5874w0d being seen today for ongoing prenatal care.  She is currently monitored for the following issues for this high-risk pregnancy and has Supervision of high-risk pregnancy; Monochorionic diamniotic twin pregnancy; IUGR (intrauterine growth restriction) baby A affecting care of mother, third trimester, fetus 1; Gestational diabetes mellitus (GDM) affecting pregnancy; and Cervical shortening affecting pregnancy, antepartum on her problem list.  Patient reports no complaints.  Contractions: Not present. Vag. Bleeding: None.  Movement: Present. Denies leaking of fluid.   The following portions of the patient's history were reviewed and updated as appropriate: allergies, current medications, past family history, past medical history, past social history, past surgical history and problem list. Problem list updated.  Objective:   Vitals:   08/03/16 1028  BP: 121/85  Pulse: 96  Weight: 142 lb 8 oz (64.6 kg)    Fetal Status: Fetal Heart Rate (bpm): 134/143   Movement: Present     General:  Alert, oriented and cooperative. Patient is in no acute distress.  Skin: Skin is warm and dry. No rash noted.   Cardiovascular: Normal heart rate noted  Respiratory: Normal respiratory effort, no problems with respiration noted  Abdomen: Soft, gravid, appropriate for gestational age. Pain/Pressure: Present     Pelvic:  Cervical exam deferred        Extremities: Normal range of motion.  Edema: Trace  Mental Status: Normal mood and affect. Normal behavior. Normal judgment and thought content.   Assessment and Plan:  Pregnancy: G3P0020 at 5674w0d  1. Cervical shortening affecting pregnancy, antepartum   2. Gestational diabetes mellitus (GDM) affecting pregnancy - on baby asa daily  3. IUGR (intrauterine growth restriction) baby A affecting care of mother, third trimester, fetus 1 - weekly BPP with MFM every Wednesday  4.  Monochorionic diamniotic twin gestation in third trimester  5. Supervision of high risk pregnancy in third trimester   Preterm labor symptoms and general obstetric precautions including but not limited to vaginal bleeding, contractions, leaking of fluid and fetal movement were reviewed in detail with the patient. Please refer to After Visit Summary for other counseling recommendations.  No Follow-up on file.   Allie Bossierove, Skyelar Halliday C, MD

## 2016-08-08 ENCOUNTER — Encounter (HOSPITAL_COMMUNITY): Payer: Self-pay

## 2016-08-08 ENCOUNTER — Ambulatory Visit (HOSPITAL_COMMUNITY)
Admission: RE | Admit: 2016-08-08 | Discharge: 2016-08-08 | Disposition: A | Discharge status: Home / Self Care | Payer: Medicaid Other | Source: Ambulatory Visit | Attending: Obstetrics and Gynecology | Admitting: Obstetrics and Gynecology

## 2016-08-08 DIAGNOSIS — Z3A31 31 weeks gestation of pregnancy: Secondary | ICD-10-CM

## 2016-08-08 DIAGNOSIS — O365931 Maternal care for other known or suspected poor fetal growth, third trimester, fetus 1: Secondary | ICD-10-CM | POA: Insufficient documentation

## 2016-08-08 DIAGNOSIS — O30033 Twin pregnancy, monochorionic/diamniotic, third trimester: Secondary | ICD-10-CM | POA: Insufficient documentation

## 2016-08-08 NOTE — Addendum Note (Signed)
Encounter addended by: Joycelyn RuaMurrow, Lawonda Pretlow Mae on: 08/08/2016  4:02 PM<BR>    Actions taken: Imaging Exam ended

## 2016-08-09 ENCOUNTER — Encounter (HOSPITAL_COMMUNITY): Payer: Self-pay | Admitting: *Deleted

## 2016-08-09 ENCOUNTER — Inpatient Hospital Stay (HOSPITAL_COMMUNITY)
Admission: AD | Admit: 2016-08-09 | Discharge: 2016-08-12 | DRG: 775 | Disposition: A | Discharge status: Home / Self Care | Payer: Medicaid Other | Source: Ambulatory Visit | Attending: Obstetrics & Gynecology | Admitting: Obstetrics & Gynecology

## 2016-08-09 DIAGNOSIS — O24419 Gestational diabetes mellitus in pregnancy, unspecified control: Secondary | ICD-10-CM | POA: Diagnosis not present

## 2016-08-09 DIAGNOSIS — O30031 Twin pregnancy, monochorionic/diamniotic, first trimester: Secondary | ICD-10-CM

## 2016-08-09 DIAGNOSIS — O24429 Gestational diabetes mellitus in childbirth, unspecified control: Secondary | ICD-10-CM | POA: Diagnosis not present

## 2016-08-09 DIAGNOSIS — O365931 Maternal care for other known or suspected poor fetal growth, third trimester, fetus 1: Secondary | ICD-10-CM | POA: Diagnosis present

## 2016-08-09 DIAGNOSIS — O42913 Preterm premature rupture of membranes, unspecified as to length of time between rupture and onset of labor, third trimester: Secondary | ICD-10-CM | POA: Diagnosis present

## 2016-08-09 DIAGNOSIS — O42113 Preterm premature rupture of membranes, onset of labor more than 24 hours following rupture, third trimester: Secondary | ICD-10-CM | POA: Diagnosis not present

## 2016-08-09 DIAGNOSIS — O24420 Gestational diabetes mellitus in childbirth, diet controlled: Secondary | ICD-10-CM | POA: Diagnosis present

## 2016-08-09 DIAGNOSIS — Z3A31 31 weeks gestation of pregnancy: Secondary | ICD-10-CM | POA: Diagnosis not present

## 2016-08-09 DIAGNOSIS — O4290 Premature rupture of membranes, unspecified as to length of time between rupture and onset of labor, unspecified weeks of gestation: Secondary | ICD-10-CM | POA: Diagnosis not present

## 2016-08-09 DIAGNOSIS — O0990 Supervision of high risk pregnancy, unspecified, unspecified trimester: Secondary | ICD-10-CM

## 2016-08-09 DIAGNOSIS — O30039 Twin pregnancy, monochorionic/diamniotic, unspecified trimester: Secondary | ICD-10-CM | POA: Diagnosis present

## 2016-08-09 DIAGNOSIS — Z7982 Long term (current) use of aspirin: Secondary | ICD-10-CM | POA: Diagnosis not present

## 2016-08-09 DIAGNOSIS — O30033 Twin pregnancy, monochorionic/diamniotic, third trimester: Secondary | ICD-10-CM | POA: Diagnosis present

## 2016-08-09 DIAGNOSIS — O4210 Premature rupture of membranes, onset of labor more than 24 hours following rupture, unspecified weeks of gestation: Secondary | ICD-10-CM | POA: Diagnosis present

## 2016-08-09 DIAGNOSIS — O26879 Cervical shortening, unspecified trimester: Secondary | ICD-10-CM | POA: Diagnosis not present

## 2016-08-09 DIAGNOSIS — O26873 Cervical shortening, third trimester: Secondary | ICD-10-CM | POA: Diagnosis present

## 2016-08-09 DIAGNOSIS — O0993 Supervision of high risk pregnancy, unspecified, third trimester: Secondary | ICD-10-CM

## 2016-08-09 DIAGNOSIS — O4200 Premature rupture of membranes, onset of labor within 24 hours of rupture, unspecified weeks of gestation: Secondary | ICD-10-CM | POA: Diagnosis present

## 2016-08-09 LAB — CBC
HEMATOCRIT: 35.7 % — AB (ref 36.0–46.0)
Hemoglobin: 12.4 g/dL (ref 12.0–15.0)
MCH: 30.9 pg (ref 26.0–34.0)
MCHC: 34.7 g/dL (ref 30.0–36.0)
MCV: 89 fL (ref 78.0–100.0)
Platelets: 246 10*3/uL (ref 150–400)
RBC: 4.01 MIL/uL (ref 3.87–5.11)
RDW: 13.1 % (ref 11.5–15.5)
WBC: 10.6 10*3/uL — ABNORMAL HIGH (ref 4.0–10.5)

## 2016-08-09 LAB — POCT FERN TEST: POCT FERN TEST: POSITIVE

## 2016-08-09 LAB — URINALYSIS, ROUTINE W REFLEX MICROSCOPIC
Bilirubin Urine: NEGATIVE
Glucose, UA: NEGATIVE mg/dL
Hgb urine dipstick: NEGATIVE
Ketones, ur: NEGATIVE mg/dL
LEUKOCYTES UA: NEGATIVE
Nitrite: NEGATIVE
PROTEIN: NEGATIVE mg/dL
Specific Gravity, Urine: 1.014 (ref 1.005–1.030)
pH: 6 (ref 5.0–8.0)

## 2016-08-09 LAB — GLUCOSE, CAPILLARY
GLUCOSE-CAPILLARY: 124 mg/dL — AB (ref 65–99)
GLUCOSE-CAPILLARY: 133 mg/dL — AB (ref 65–99)
Glucose-Capillary: 115 mg/dL — ABNORMAL HIGH (ref 65–99)
Glucose-Capillary: 119 mg/dL — ABNORMAL HIGH (ref 65–99)
Glucose-Capillary: 124 mg/dL — ABNORMAL HIGH (ref 65–99)

## 2016-08-09 LAB — TYPE AND SCREEN
ABO/RH(D): O POS
ANTIBODY SCREEN: NEGATIVE

## 2016-08-09 LAB — ABO/RH: ABO/RH(D): O POS

## 2016-08-09 MED ORDER — DEXTROSE 5 % IV SOLN
500.0000 mg | INTRAVENOUS | Status: AC
  Administered 2016-08-09 – 2016-08-10 (×2): 500 mg via INTRAVENOUS
  Filled 2016-08-09 (×2): qty 500

## 2016-08-09 MED ORDER — PRENATAL MULTIVITAMIN CH
1.0000 | ORAL_TABLET | Freq: Every day | ORAL | Status: DC
  Administered 2016-08-09 – 2016-08-11 (×2): 1 via ORAL
  Filled 2016-08-09 (×3): qty 1

## 2016-08-09 MED ORDER — CALCIUM CARBONATE ANTACID 500 MG PO CHEW: 2.0000 | CHEWABLE_TABLET | ORAL | Status: DC | PRN

## 2016-08-09 MED ORDER — ACETAMINOPHEN 325 MG PO TABS: 650.0000 mg | ORAL_TABLET | ORAL | Status: DC | PRN

## 2016-08-09 MED ORDER — ZOLPIDEM TARTRATE 5 MG PO TABS
5.0000 mg | ORAL_TABLET | Freq: Every evening | ORAL | Status: DC | PRN
  Administered 2016-08-09: 5 mg via ORAL
  Filled 2016-08-09: qty 1

## 2016-08-09 MED ORDER — MAGNESIUM SULFATE BOLUS VIA INFUSION
4.0000 g | Freq: Once | INTRAVENOUS | Status: AC
  Administered 2016-08-09: 4 g via INTRAVENOUS
  Filled 2016-08-09: qty 500

## 2016-08-09 MED ORDER — AMPICILLIN SODIUM 2 G IJ SOLR
2.0000 g | Freq: Four times a day (QID) | INTRAMUSCULAR | Status: DC
  Administered 2016-08-09 – 2016-08-10 (×6): 2 g via INTRAVENOUS
  Filled 2016-08-09 (×6): qty 2000

## 2016-08-09 MED ORDER — LACTATED RINGERS IV SOLN
INTRAVENOUS | Status: DC
  Administered 2016-08-09: 75 mL/h via INTRAVENOUS
  Administered 2016-08-09 – 2016-08-11 (×3): via INTRAVENOUS

## 2016-08-09 MED ORDER — ASPIRIN EC 81 MG PO TBEC
81.0000 mg | DELAYED_RELEASE_TABLET | Freq: Every day | ORAL | Status: DC
  Administered 2016-08-09: 81 mg via ORAL
  Filled 2016-08-09 (×2): qty 1

## 2016-08-09 MED ORDER — FERROUS SULFATE 325 (65 FE) MG PO TABS
325.0000 mg | ORAL_TABLET | Freq: Every day | ORAL | Status: DC
  Administered 2016-08-09 – 2016-08-11 (×3): 325 mg via ORAL
  Filled 2016-08-09 (×3): qty 1

## 2016-08-09 MED ORDER — BETAMETHASONE SOD PHOS & ACET 6 (3-3) MG/ML IJ SUSP
12.0000 mg | INTRAMUSCULAR | Status: AC
  Administered 2016-08-09 – 2016-08-10 (×2): 12 mg via INTRAMUSCULAR
  Filled 2016-08-09 (×2): qty 2

## 2016-08-09 MED ORDER — ZOLPIDEM TARTRATE 5 MG PO TABS: 5.0000 mg | ORAL_TABLET | Freq: Every evening | ORAL | Status: DC | PRN

## 2016-08-09 MED ORDER — SODIUM CHLORIDE 0.9 % IV SOLN
8.0000 mg | Freq: Three times a day (TID) | INTRAVENOUS | Status: DC | PRN
  Administered 2016-08-09: 8 mg via INTRAVENOUS
  Filled 2016-08-09: qty 4

## 2016-08-09 MED ORDER — MAGNESIUM SULFATE 40 G IN LACTATED RINGERS - SIMPLE
2.0000 g/h | INTRAVENOUS | Status: DC
  Administered 2016-08-09 (×2): 2 g/h via INTRAVENOUS
  Filled 2016-08-09: qty 500
  Filled 2016-08-09: qty 40

## 2016-08-09 MED ORDER — AZITHROMYCIN 250 MG PO TABS
500.0000 mg | ORAL_TABLET | Freq: Every day | ORAL | Status: DC
Start: 2016-08-11 — End: 2016-08-10

## 2016-08-09 MED ORDER — DOCUSATE SODIUM 100 MG PO CAPS
100.0000 mg | ORAL_CAPSULE | Freq: Every day | ORAL | Status: DC
  Administered 2016-08-09 – 2016-08-12 (×3): 100 mg via ORAL
  Filled 2016-08-09 (×4): qty 1

## 2016-08-09 MED ORDER — AMOXICILLIN 500 MG PO CAPS: 500.0000 mg | ORAL_CAPSULE | Freq: Three times a day (TID) | ORAL | Status: DC

## 2016-08-09 NOTE — H&P (Signed)
LABOR ADMISSION HISTORY AND PHYSICAL  Andrea Romero is a 26 y.o. female G3P0020 with IUP of mono/di twins at 1w6dby LMP presenting for PPROM. Reports feeling gush of fluid around 0130 this morning while laying in bed. Fluid was clear. Subsequently presented to MAU. Denies contractions or discomfort, only pelvic pressure. Endorses fetal movement. Denies vaginal bleeding. Of note, patient with pessary in place.   Dating: By LMP --->  Estimated Date of Delivery: 10/05/16  Sono:    Bedside '@[redacted]w[redacted]d' , cephalic presentation of both fetuses, Twin A with IUGR  Prenatal History/Complications: Monitored by high risk clinic throughout pregnancy for: Monochorionic diamniotic twin pregnancy IUGR baby A Gestational diabetes mellitus - taking daily ASA 858mCervical shortening  Past Medical History: Past Medical History:  Diagnosis Date  . Medical history non-contributory     Past Surgical History: Past Surgical History:  Procedure Laterality Date  . NO PAST SURGERIES      Obstetrical History: OB History    Gravida Para Term Preterm AB Living   3       2     SAB TAB Ectopic Multiple Live Births   2              Social History: Social History   Social History  . Marital status: Single    Spouse name: N/A  . Number of children: N/A  . Years of education: N/A   Social History Main Topics  . Smoking status: Never Smoker  . Smokeless tobacco: Never Used  . Alcohol use No  . Drug use: No  . Sexual activity: Yes    Birth control/ protection: None   Other Topics Concern  . None   Social History Narrative  . None    Family History: History reviewed. No pertinent family history.  Allergies: No Known Allergies  Prescriptions Prior to Admission  Medication Sig Dispense Refill Last Dose  . ACCU-CHEK FASTCLIX LANCETS MISC 1 each by Percutaneous route 4 (four) times daily. 100 each 12 08/09/2016 at Unknown time  . aspirin EC 81 MG tablet Take 81 mg by mouth at bedtime.    08/08/2016 at Unknown time  . Blood Glucose Monitoring Suppl (ACCU-CHEK GUIDE) w/Device KIT 1 kit by Does not apply route 4 (four) times daily. 1 kit 0 08/09/2016 at Unknown time  . glucose blood (ACCU-CHEK GUIDE) test strip Use 1 strip 4 times daily dx o24.419 gestational diabetes 100 each 12 08/09/2016 at Unknown time  . IRON PO Take 1 tablet by mouth at bedtime.    08/08/2016 at Unknown time  . Prenatal Vit-Fe Fumarate-FA (PRENATAL VITAMIN PO) Take 1 tablet by mouth at bedtime.    08/08/2016 at Unknown time     Review of Systems   All systems reviewed and negative except as stated in HPI  BP 126/87 (BP Location: Right Arm)   Pulse (!) 113   Temp 98.7 F (37.1 C) (Oral)   Resp 16   Ht 4' 11.5" (1.511 Andrea)   Wt 144 lb (65.3 kg)   LMP 12/30/2015   SpO2 100%   BMI 28.60 kg/Andrea  General appearance: alert, cooperative and appears stated age Lungs: clear to auscultation bilaterally Heart: regular rate and rhythm Abdomen: soft, non-tender; bowel sounds normal Extremities: no sign of DVT, edema Presentation: cephalic Fetal monitoringBaseline: 140 bpm, Variability: Good {> 6 bpm), Accelerations: Reactive and Decelerations: Absent Uterine activity: Irregular    Prenatal labs: ABO, Rh: O/Positive/-- (01/02 0000) Antibody: Negative (01/02 0000) Rubella: !Error! RPR: Non Reactive (  04/23 0809)  HBsAg: Negative (01/02 0000)  HIV: Non Reactive (04/23 0809)  GBS:   Unknown   Prenatal Transfer Tool  Maternal Diabetes: Yes:  Diabetes Type:  Diet controlled Genetic Screening: Normal Maternal Ultrasounds/Referrals: Normal Fetal Ultrasounds or other Referrals:  Other: IUGR in Twin A with 39% discrepancy Maternal Substance Abuse:  No Significant Maternal Medications:  Meds include: Other: ASA Significant Maternal Lab Results: None  Results for orders placed or performed during the hospital encounter of 08/09/16 (from the past 24 hour(s))  Urinalysis, Routine w reflex microscopic   Collection  Time: 08/09/16  2:04 AM  Result Value Ref Range   Color, Urine YELLOW YELLOW   APPearance CLEAR CLEAR   Specific Gravity, Urine 1.014 1.005 - 1.030   pH 6.0 5.0 - 8.0   Glucose, UA NEGATIVE NEGATIVE mg/dL   Hgb urine dipstick NEGATIVE NEGATIVE   Bilirubin Urine NEGATIVE NEGATIVE   Ketones, ur NEGATIVE NEGATIVE mg/dL   Protein, ur NEGATIVE NEGATIVE mg/dL   Nitrite NEGATIVE NEGATIVE   Leukocytes, UA NEGATIVE NEGATIVE  Fern Test   Collection Time: 08/09/16  2:26 AM  Result Value Ref Range   POCT Fern Test Positive = ruptured amniotic membanes   Glucose, capillary   Collection Time: 08/09/16  2:49 AM  Result Value Ref Range   Glucose-Capillary 115 (H) 65 - 99 mg/dL    Patient Active Problem List   Diagnosis Date Noted  . Amniotic fluid leaking 08/09/2016  . Cervical shortening affecting pregnancy, antepartum 07/30/2016  . Gestational diabetes mellitus (GDM) affecting pregnancy 07/17/2016  . Supervision of high-risk pregnancy 07/04/2016  . Monochorionic diamniotic twin pregnancy 07/04/2016  . IUGR (intrauterine growth restriction) baby A affecting care of mother, third trimester, fetus 1 07/04/2016    Assessment: Andrea Romero is a 26 y.o. G3P0020 at 74w6dwith monochorionic diamniotic twin pregnancy here for PPROM.  #Labor: Will observe on L&D and transfer to antenatal if no progression of labor. No augmentation as preterm. Will begin latency antibiotics (amp, azithro), mag, and give BMZ. Pessary in place for now.  #Pain: IV pain meds and epidural available at patient's request #FWB: Cat 1 #ID:  GBS unknown. Cultured collected in MAU prior to beginning abx.  #MOF: Breast #MOC: Nexplanon #Circ:  N/A (female x2)  AAdin Hector MD, MPH PGY-2 MZac[facility] PontesFamily Medicine Pager [phone] CNM attestation:  I have seen and examined this patient; I agree with above documentation in the resident's note.   [name] a 26y.o. G3P0020 here for PPROM, mono/di vtx/vtx  twins  PE: BP 108/68   Pulse 95   Temp 98.5 F (36.9 C) (Oral)   Resp 18   Ht 4' 11.5" (1.511 Andrea)   Wt 65.3 kg (144 lb)   LMP 12/30/2015   SpO2 99%   BMI 28.60 kg/Andrea  Gen: calm comfortable, NAD Resp: normal effort, no distress Abd: gravid  ROS, labs, PMH reviewed  [facility] Latency abx, BMZ, and mag for NP started Expectant management Rev plan for pessary at rounds  SSerita GrammesCNM 08/09/2016, 7:38 AM

## 2016-08-09 NOTE — MAU Provider Note (Signed)
S: Birder Robsonmmery Clingerman is a 26 y.o. G3P0020 at 6716w6d who presents today with leaking of fluid. She states that she had a gush of fluid around 0130, and she has continued to leak. She denies any VB. She confirms fetal movement. She reports that she is feeing contractions. She reports them as mild.  O: VSS, afebrile Abdomen: soft, non-tender, gravid Grossly ruptured  Uterus: AGA FHT: 135, moderate with 15x15 accel, no decels  B: 140, moderate with 15x15 accels, no decels  Toco: every 2-3 mins  Bedside US: VTX/VTX  Results for orders placed or performed during the hospital encounter of 08/09/16 (from the past 24 hour(s))  Urinalysis, Routine w reflex microscopic     Status: None   Collection Time: 08/09/16  2:04 AM  Result Value Ref Range   Color, Urine YELLOW YELLOW   APPearance CLEAR CLEAR   Specific Gravity, Urine 1.014 1.005 - 1.030   pH 6.0 5.0 - 8.0   Glucose, UA NEGATIVE NEGATIVE mg/dL   Hgb urine dipstick NEGATIVE NEGATIVE   Bilirubin Urine NEGATIVE NEGATIVE   Ketones, ur NEGATIVE NEGATIVE mg/dL   Protein, ur NEGATIVE NEGATIVE mg/dL   Nitrite NEGATIVE NEGATIVE   Leukocytes, UA NEGATIVE NEGATIVE  Fern Test     Status: None   Collection Time: 08/09/16  2:26 AM  Result Value Ref Range   POCT Fern Test Positive = ruptured amniotic membanes    A/P: Exam for ROM RN will report to labor team  Thressa ShellerHeather Hogan  2:37 AM 08/09/16

## 2016-08-09 NOTE — Consult Note (Addendum)
[facility]  [facility] 08/09/2016    10:56 PM  Neonatal Medicine Consultation         [name]          MRN:  [id number]  I was called at the request of the patient's obstetrician (Dr. Erin FullingHarraway-Bradee Common) to speak to this patient due to potential premature delivery of twins as early as 4031 6/7 weeks.  The patient's prenatal course includes monochorionic/diamniotic twins, short cervix, gestational diabetes (diet managed), and IUGR of twin A.  She is 31 6/7 weeks currently.  [facility] after presenting early this morning with PROM.  She is receiving treatment that includes legacy antibiotics, betamethasone, and magnesium sulfate.  The babies are female.  I reviewed expectations for a baby born as early as 32 weeks, including survival, length of stay, morbidities such as respiratory distress, IVH, infection, feeding intolerance.  I described how we provide respiratory and feeding support.  Mom plans to breast feed, which I encouraged as best for the baby (with supplementations for needed calories).  I let mom know that the babies' outlook generally improve the longer they remain undelivered.  I spent 30 minutes reviewing the record, speaking to the patient, and entering appropriate documentation.  More than 50% of the time was spent face to face with patient.   _____________________ Electronically Signed By: Angelita InglesMcCrae S. Dalores Weger, MD Neonatologist

## 2016-08-09 NOTE — MAU Note (Signed)
Pt states she was lying in bed and felt a large gush of fluid around 0130. Has continued to leak since. Pt denies contractions, pain or vaginal bleeding. Reports good fetal movement.

## 2016-08-09 NOTE — Progress Notes (Addendum)
Initial Nutrition Assessment DOCUMENTATION CODES:  Not applicable  INTERVENTION:  Carbohydrate modified gestational diabetic diet  NUTRITION DIAGNOSIS:  Increased nutrient needs related to  (pregnancy and fetal growth requirments) as evidenced by  (31 weeks, twin pregnancy).  GOAL:  Patient will meet greater than or equal to 90% of their needs  MONITOR:  Weight trends  REASON FOR ASSESSMENT:  Antenatal, Other (Comment) (GDM)   ASSESSMENT:  31 6/7 weeks, PROM, IUGR of twin A, GDM. weight at 12 weeks 113 lbs, BMI 22.5.  31 lb weight gain PNV and iron suppl, 5/17 Hct 35 %  Diet Order:  Diet gestational carb mod Room service appropriate? Yes; Fluid consistency: Thin  Height:   Ht Readings from Last 1 Encounters:  08/09/16 4' 11.5" (1.511 m)   Weight:   Wt Readings from Last 1 Encounters:  08/09/16 144 lb (65.3 kg)    Ideal Body Weight:   100 lbs  BMI:  Body mass index is 28.6 kg/m.  Estimated Nutritional Needs:   Kcal:  1800-2000  Protein:  80-90 g  Fluid:  2.2 L  EDUCATION NEEDS:   No education needs identified at this time (outpt education 07/23/16)  Elisabeth CaraKatherine Loralie Malta M.Odis LusterEd. R.D. LDN Neonatal Nutrition Support Specialist/RD III Pager [phone]      Phone [phone]

## 2016-08-09 NOTE — Progress Notes (Signed)
Patient ID: Andrea Romero, female   DOB: 03-Feb-1991, 26 y.o.   MRN: [id number] ACULTY PRACTICE ANTEPARTUM COMPREHENSIVE PROGRESS NOTE  Andrea Robsonmmery Milliron is a 26 y.o. G3P0020 at 9358w6d  who is admitted for PROM, IUGR or twin A, GDM, Mono/Di twin IUP .   Fetal presentation is cephalic and cephalic. Length of Stay:  0  Days  Subjective: Pt reports continued leakage of clear fluid.  She denies feeling pain in her abd at all.  + FM no VB. Patient reports good fetal movement.  She reports no perceived uterine contractions and no bleeding.  Vitals:  Blood pressure 124/74, pulse (!) 107, temperature 98.4 F (36.9 C), temperature source Oral, resp. rate 20, height 4' 11.5" (1.511 m), weight 144 lb (65.3 kg), last menstrual period 12/30/2015, SpO2 98 %. Physical Examination: General appearance - alert, well appearing, and in no distress Abdomen - soft, nontender, nondistended, no masses or organomegaly gravid Cervical Exam: Evaluated by digital exam, Position: mid position, Dilation: 1cm,  Consistency: soft and found to be 70% effaced-3 and fetal presentation is cephalic. The pessary ws removed without difficulty. Extremities: extremities normal, atraumatic, no cyanosis or edema  Membranes:ruptured, clear fluid  Fetal Monitoring:  Baseline: 120's x2 bpm, Variability: Good {> 6 bpm), Accelerations: Reactive and Decelerations: Absent  Labs:  Results for orders placed or performed during the hospital encounter of 08/09/16 (from the past 24 hour(s))  Urinalysis, Routine w reflex microscopic   Collection Time: 08/09/16  2:04 AM  Result Value Ref Range   Color, Urine YELLOW YELLOW   APPearance CLEAR CLEAR   Specific Gravity, Urine 1.014 1.005 - 1.030   pH 6.0 5.0 - 8.0   Glucose, UA NEGATIVE NEGATIVE mg/dL   Hgb urine dipstick NEGATIVE NEGATIVE   Bilirubin Urine NEGATIVE NEGATIVE   Ketones, ur NEGATIVE NEGATIVE mg/dL   Protein, ur NEGATIVE NEGATIVE mg/dL   Nitrite NEGATIVE NEGATIVE   Leukocytes, UA  NEGATIVE NEGATIVE  Fern Test   Collection Time: 08/09/16  2:26 AM  Result Value Ref Range   POCT Fern Test Positive = ruptured amniotic membanes   Type and screen [facility]   Collection Time: 08/09/16  2:48 AM  Result Value Ref Range   ABO/RH(D) O POS    Antibody Screen NEG    Sample Expiration 08/12/2016   ABO/Rh   Collection Time: 08/09/16  2:48 AM  Result Value Ref Range   ABO/RH(D) O POS   Glucose, capillary   Collection Time: 08/09/16  2:49 AM  Result Value Ref Range   Glucose-Capillary 115 (H) 65 - 99 mg/dL  Glucose, capillary   Collection Time: 08/09/16  6:52 AM  Result Value Ref Range   Glucose-Capillary 124 (H) 65 - 99 mg/dL  Glucose, capillary   Collection Time: 08/09/16  8:39 AM  Result Value Ref Range   Glucose-Capillary 133 (H) 65 - 99 mg/dL  CBC   Collection Time: 08/09/16 12:28 PM  Result Value Ref Range   WBC 10.6 (H) 4.0 - 10.5 K/uL   RBC 4.01 3.87 - 5.11 MIL/uL   Hemoglobin 12.4 12.0 - 15.0 g/dL   HCT 40.935.7 (L) 81.136.0 - 91.446.0 %   MCV 89.0 78.0 - 100.0 fL   MCH 30.9 26.0 - 34.0 pg   MCHC 34.7 30.0 - 36.0 g/dL   RDW 78.213.1 95.611.5 - 21.315.5 %   Platelets 246 150 - 400 K/uL    Imaging Studies:    08/08/2016 Impression  Monochorionic/diamniotic twin pregnancy at 31+5 weeks,  with IUGR of twin A  Normal amniotic fluid volume x 2  BPP 8/8 x 2  Dopplers on both twins show no absent or reversed end  disatolic flow ---------------------------------------------------------------------- Recommendations  Continue weekly evaluations as ordered  Medications:  Scheduled . [START ON 08/11/2016] amoxicillin  500 mg Oral Q8H  . aspirin EC  81 mg Oral QHS  . [START ON 08/11/2016] azithromycin  500 mg Oral Daily  . betamethasone acetate-betamethasone sodium phosphate  12 mg Intramuscular Q24H  . docusate sodium  100 mg Oral Daily  . ferrous sulfate  325 mg Oral QHS  . prenatal multivitamin  1 tablet Oral Q1200   I have reviewed the patient's current  medications.  ASSESSMENT: Patient Active Problem List   Diagnosis Date Noted  . Amniotic fluid leaking 08/09/2016  . Prolonged premature rupture of membranes 08/09/2016  . Cervical shortening affecting pregnancy, antepartum 07/30/2016  . Gestational diabetes mellitus (GDM) affecting pregnancy 07/17/2016  . Supervision of high-risk pregnancy 07/04/2016  . Monochorionic diamniotic twin pregnancy 07/04/2016  . IUGR (intrauterine growth restriction) baby A affecting care of mother, third trimester, fetus 1 07/04/2016    PLAN: Continue atbx for latency NICU consult Pessary removed  Keep Magnesium sulfate until steroid benefit S/p BMZ x1.awaiting 2nd dose Deliver for maternal or fetal indications.    Fetal dopplers in 1 week Deliver at 34 week or sooner prn   Continue routine antenatal care Check FSG fasting and postprandial    Morgen Ritacco Harraway-Smith 08/09/2016,2:34 PM

## 2016-08-10 ENCOUNTER — Encounter (HOSPITAL_COMMUNITY): Payer: Self-pay | Admitting: *Deleted

## 2016-08-10 DIAGNOSIS — O26879 Cervical shortening, unspecified trimester: Secondary | ICD-10-CM

## 2016-08-10 DIAGNOSIS — O4290 Premature rupture of membranes, unspecified as to length of time between rupture and onset of labor, unspecified weeks of gestation: Secondary | ICD-10-CM

## 2016-08-10 DIAGNOSIS — O24419 Gestational diabetes mellitus in pregnancy, unspecified control: Secondary | ICD-10-CM

## 2016-08-10 DIAGNOSIS — O30033 Twin pregnancy, monochorionic/diamniotic, third trimester: Secondary | ICD-10-CM

## 2016-08-10 LAB — GLUCOSE, CAPILLARY
GLUCOSE-CAPILLARY: 139 mg/dL — AB (ref 65–99)
GLUCOSE-CAPILLARY: 201 mg/dL — AB (ref 65–99)
GLUCOSE-CAPILLARY: 144 mg/dL — AB (ref 65–99)
GLUCOSE-CAPILLARY: 138 mg/dL — AB (ref 65–99)
Glucose-Capillary: 134 mg/dL — ABNORMAL HIGH (ref 65–99)
Glucose-Capillary: 163 mg/dL — ABNORMAL HIGH (ref 65–99)
Glucose-Capillary: 114 mg/dL — ABNORMAL HIGH (ref 65–99)
Glucose-Capillary: 191 mg/dL — ABNORMAL HIGH (ref 65–99)
Glucose-Capillary: 131 mg/dL — ABNORMAL HIGH (ref 65–99)
Glucose-Capillary: 107 mg/dL — ABNORMAL HIGH (ref 65–99)

## 2016-08-10 LAB — CBC
HCT: 34.9 % — ABNORMAL LOW (ref 36.0–46.0)
Hemoglobin: 11.9 g/dL — ABNORMAL LOW (ref 12.0–15.0)
MCH: 30.6 pg (ref 26.0–34.0)
MCHC: 34.1 g/dL (ref 30.0–36.0)
MCV: 89.7 fL (ref 78.0–100.0)
Platelets: 236 K/uL (ref 150–400)
RBC: 3.89 MIL/uL (ref 3.87–5.11)
RDW: 13.5 % (ref 11.5–15.5)
WBC: 14.3 K/uL — ABNORMAL HIGH (ref 4.0–10.5)

## 2016-08-10 MED ORDER — OXYCODONE-ACETAMINOPHEN 5-325 MG PO TABS: 2.0000 | ORAL_TABLET | ORAL | Status: DC | PRN

## 2016-08-10 MED ORDER — HYDROXYZINE HCL 50 MG PO TABS
50.0000 mg | ORAL_TABLET | Freq: Four times a day (QID) | ORAL | Status: DC | PRN
  Filled 2016-08-10: qty 1

## 2016-08-10 MED ORDER — OXYTOCIN 40 UNITS IN LACTATED RINGERS INFUSION - SIMPLE MED
2.5000 [IU]/h | INTRAVENOUS | Status: DC
  Filled 2016-08-10: qty 1000

## 2016-08-10 MED ORDER — FENTANYL CITRATE (PF) 100 MCG/2ML IJ SOLN
50.0000 ug | INTRAMUSCULAR | Status: DC | PRN
  Administered 2016-08-10 (×2): 100 ug via INTRAVENOUS
  Filled 2016-08-10 (×2): qty 2

## 2016-08-10 MED ORDER — SODIUM CHLORIDE 0.9 % IV SOLN
2.0000 g | Freq: Four times a day (QID) | INTRAVENOUS | Status: DC
  Administered 2016-08-10 (×2): 2 g via INTRAVENOUS
  Filled 2016-08-10 (×4): qty 2000

## 2016-08-10 MED ORDER — SOD CITRATE-CITRIC ACID 500-334 MG/5ML PO SOLN: 30.0000 mL | ORAL | Status: DC | PRN

## 2016-08-10 MED ORDER — FLEET ENEMA 7-19 GM/118ML RE ENEM: 1.0000 | ENEMA | Freq: Every day | RECTAL | Status: DC | PRN

## 2016-08-10 MED ORDER — AMPICILLIN SODIUM 2 G IJ SOLR: 2.0000 g | Freq: Four times a day (QID) | INTRAMUSCULAR | Status: DC

## 2016-08-10 MED ORDER — AMOXICILLIN 500 MG PO CAPS: 500.0000 mg | ORAL_CAPSULE | Freq: Three times a day (TID) | ORAL | Status: DC

## 2016-08-10 MED ORDER — OXYCODONE-ACETAMINOPHEN 5-325 MG PO TABS: 1.0000 | ORAL_TABLET | ORAL | Status: DC | PRN

## 2016-08-10 MED ORDER — ONDANSETRON HCL 4 MG/2ML IJ SOLN
4.0000 mg | Freq: Four times a day (QID) | INTRAMUSCULAR | Status: DC | PRN
  Administered 2016-08-10: 4 mg via INTRAVENOUS
  Filled 2016-08-10: qty 2

## 2016-08-10 MED ORDER — AMOXICILLIN 500 MG PO CAPS
500.0000 mg | ORAL_CAPSULE | Freq: Three times a day (TID) | ORAL | Status: DC
  Filled 2016-08-10 (×2): qty 1

## 2016-08-10 MED ORDER — SODIUM CHLORIDE 0.9 % IV SOLN
INTRAVENOUS | Status: DC
  Administered 2016-08-10: 4.2 [IU]/h via INTRAVENOUS
  Administered 2016-08-10: 2.1 [IU]/h via INTRAVENOUS
  Administered 2016-08-10: 0.8 [IU]/h via INTRAVENOUS
  Administered 2016-08-10: 7.9 [IU]/h via INTRAVENOUS
  Administered 2016-08-10: 3.4 [IU]/h via INTRAVENOUS
  Administered 2016-08-10: 3.9 [IU]/h via INTRAVENOUS
  Filled 2016-08-10: qty 1

## 2016-08-10 MED ORDER — LACTATED RINGERS IV SOLN: 500.0000 mL | Freq: Once | INTRAVENOUS | Status: DC

## 2016-08-10 MED ORDER — PHENYLEPHRINE 40 MCG/ML (10ML) SYRINGE FOR IV PUSH (FOR BLOOD PRESSURE SUPPORT)
80.0000 ug | PREFILLED_SYRINGE | INTRAVENOUS | Status: DC | PRN
  Filled 2016-08-10: qty 5

## 2016-08-10 MED ORDER — EPHEDRINE 5 MG/ML INJ
10.0000 mg | INTRAVENOUS | Status: DC | PRN
  Filled 2016-08-10: qty 2

## 2016-08-10 MED ORDER — LACTATED RINGERS IV SOLN: INTRAVENOUS | Status: DC

## 2016-08-10 MED ORDER — AZITHROMYCIN 250 MG PO TABS: 500.0000 mg | ORAL_TABLET | Freq: Every day | ORAL | Status: DC

## 2016-08-10 MED ORDER — DIPHENHYDRAMINE HCL 50 MG/ML IJ SOLN: 12.5000 mg | INTRAMUSCULAR | Status: DC | PRN

## 2016-08-10 MED ORDER — LACTATED RINGERS IV SOLN: 500.0000 mL | INTRAVENOUS | Status: DC | PRN

## 2016-08-10 MED ORDER — AZITHROMYCIN 250 MG PO TABS
500.0000 mg | ORAL_TABLET | Freq: Every day | ORAL | Status: DC
  Administered 2016-08-11: 500 mg via ORAL
  Filled 2016-08-10: qty 2

## 2016-08-10 MED ORDER — ACETAMINOPHEN 325 MG PO TABS: 650.0000 mg | ORAL_TABLET | ORAL | Status: DC | PRN

## 2016-08-10 MED ORDER — FENTANYL 2.5 MCG/ML BUPIVACAINE 1/10 % EPIDURAL INFUSION (WH - ANES): 14.0000 mL/h | INTRAMUSCULAR | Status: DC | PRN

## 2016-08-10 MED ORDER — OXYTOCIN BOLUS FROM INFUSION
500.0000 mL | Freq: Once | INTRAVENOUS | Status: AC
  Administered 2016-08-11: 500 mL/h via INTRAVENOUS

## 2016-08-10 MED ORDER — LIDOCAINE HCL (PF) 1 % IJ SOLN
30.0000 mL | INTRAMUSCULAR | Status: DC | PRN
  Filled 2016-08-10 (×2): qty 30

## 2016-08-10 MED ORDER — SODIUM CHLORIDE 0.9 % IV SOLN: 2.0000 g | Freq: Four times a day (QID) | INTRAVENOUS | Status: DC

## 2016-08-10 MED ORDER — DEXTROSE IN LACTATED RINGERS 5 % IV SOLN
INTRAVENOUS | Status: DC
  Administered 2016-08-10 (×2): via INTRAVENOUS

## 2016-08-10 NOTE — Progress Notes (Signed)
Faculty Practice OB/GYN Attending Note   Subjective:  Called to evaluate patient with contractions every 1-2 minutes and pelvic pressure.  FHR reassuring and reactive x 2, no vaginal bleeding. Good FM x 2.   Admitted on 08/09/2016 for Prolonged premature rupture of membranes. Just received second dose of betamethasone at 0300, also had NICU consult. Also had over 24 hours of magnesium sulfate.        Objective:  Blood pressure 103/64, pulse 96, temperature 98.5 F (36.9 C), resp. rate 20, height 4' 11.5" (1.511 m), weight 144 lb (65.3 kg), last menstrual period 12/30/2015, SpO2 99 %. FHT  Baseline 130 bpm, moderate variability, +accelerations, no decelerations for both twins Toco: q1-2 minutes Gen: Crying due to pain HENT: Normocephalic, atraumatic Lungs: Normal respiratory effort Heart: Regular rate noted Abdomen: NT, gravid fundus, soft Cervix: 4/100/+1/vertex Ext: 2+ DTRs, no edema, no cyanosis, negative Homan's sign  Assessment & Plan:  26 y.o. G3P0020 at 773w0d admitted for PPROM, now progressing in PTL. Twin A with IUGR - Will move to L&D, no indication for tocolysis.   - Patient desires epidural for analgesia - Continue Ampicillin for GBS prophylaxis - Reassuring FHT x 2 - Hopeful for vaginal delivery Continue close observation.  Jaynie CollinsUGONNA  Shadonna Benedick, MD, FACOG Attending Obstetrician & Gynecologist Faculty Practice, [facility] [place]

## 2016-08-10 NOTE — Progress Notes (Signed)
Patient ID: Birder RobsonEmmery Romero, female   DOB: 16-Apr-1990, 26 y.o.   MRN: [id number]  Per Dr Despina HiddenEure, pt to be tx back upstairs to the High Risk OB unit as ctx have spaced out as the day progressed. This was rev'd w/ pt and family and questions were answered. Per RN report, pt was doing nipple stim and family rubbing feet to try to stimulate ctx. This was addressed and strongly discouraged; pt agreeable.  Cam HaiSHAW, Surya Schroeter CNM 08/10/2016 9:27 PM

## 2016-08-10 NOTE — Anesthesia Pain Management Evaluation Note (Signed)
  CRNA Pain Management Visit Note  Patient: Andrea RobsonEmmery Romero, 26 y.o., female  "Hello I am a member of the anesthesia team at [facility]. We have an anesthesia team available at all times to provide care throughout the hospital, including epidural management and anesthesia for C-section. I don't know your plan for the delivery whether it a natural birth, water birth, IV sedation, nitrous supplementation, doula or epidural, but we want to meet your pain goals."   1.Was your pain managed to your expectations on prior hospitalizations?   No prior hospitalizations  2.What is your expectation for pain management during this hospitalization?     Labor support without medications  3.How can we help you reach that goal? Natural   Record the patient's initial score and the patient's pain goal.   Pain: 4  Pain Goal: 6 [facility] wants you to be able to say your pain was always managed very well.  Andrea Romero 08/10/2016

## 2016-08-10 NOTE — Progress Notes (Addendum)
Patient ID: Andrea Romero, female   DOB: 10-06-1990, 26 y.o.   MRN: [id number]  S: Patient seen & examined for progress of labor. Patient comfortable after given Fentanyl for contraction pain.    O:  Vitals:   08/10/16 1036 08/10/16 1128 08/10/16 1232 08/10/16 1330  BP: (!) 113/93 (!) 110/52 (!) 107/50 (!) 109/53  Pulse: 98 (!) 102 (!) 106 98  Resp: 18  20 18   Temp: 98.7 F (37.1 C)  97.4 F (36.3 C) 97.8 F (36.6 C)  TempSrc: Oral     SpO2:      Weight:      Height:        Dilation: 4 Effacement (%): 100 Station: +1 Presentation: Vertex Exam by:: Dr. Omer JackMumaw   FHT: Twin A 120 bpm, mod var, +accels, no decels; Twin B 120 bpm, mod var, +accels, no decels TOCO: q4-895min  BSUS performed to assess position of baby's and their FHT placement. Twin A and B are both vertex. Twin A FHR is located in left lower quadrant; Twin B FHR is mid, right of midline.   A/P: Had started glucostabilizer due to elevated BS Vertex - Vertex Continue expectant management Anticipate SVD

## 2016-08-10 NOTE — Progress Notes (Signed)
Pt asked to lay back in bed for NST-fhr tracing very intermittetn on both--pt requests to only lie on side or squat or on ball. Pt willing lie on back for quick cardio placement.

## 2016-08-10 NOTE — Progress Notes (Signed)
Patient ID: Andrea Romero, female   DOB: 1990-06-30, 26 y.o.   MRN: [id number] ACULTY PRACTICE ANTEPARTUM COMPREHENSIVE PROGRESS NOTE  Andrea Romero is a 26 y.o. G3P0020 at [redacted]w[redacted]d  who is admitted for PROM.   Fetal presentation is cephalic and cephalic. Length of Stay:  1  Days  Subjective: Pt reports contractions overnight. She was able to sleep after receiving Ambien. Patient reports good fetal movement.  She reports occuterine contractions, no bleeding but, continued leakage of clear fluid.  and no loss of fluid per vagina.  Vitals:  Blood pressure (!) 104/53, pulse (!) 103, temperature 98.5 F (36.9 C), resp. rate 18, height 4' 11.5" (1.511 m), weight 144 lb (65.3 kg), last menstrual period 12/30/2015, SpO2 99 %. Physical Examination: General appearance - alert, well appearing, and in no distress Abdomen - soft, nontender, nondistended, no masses or organomegaly gravid Cervical Exam: Not evaluated.. Extremities: No clubbing, cyanosis or edema. SCD's in place.  Membranes:ruptured  Fetal Monitoring:  Baseline: 120's and 140's bpm, Variability: Good {> 6 bpm), Accelerations: Reactive and Decelerations: Variable: mild x 2  Labs:  Results for orders placed or performed during the hospital encounter of 08/09/16 (from the past 24 hour(s))  Glucose, capillary   Collection Time: 08/09/16  8:39 AM  Result Value Ref Range   Glucose-Capillary 133 (H) 65 - 99 mg/dL  CBC   Collection Time: 08/09/16 12:28 PM  Result Value Ref Range   WBC 10.6 (H) 4.0 - 10.5 K/uL   RBC 4.01 3.87 - 5.11 MIL/uL   Hemoglobin 12.4 12.0 - 15.0 g/dL   HCT 40.9 (L) 81.1 - 91.4 %   MCV 89.0 78.0 - 100.0 fL   MCH 30.9 26.0 - 34.0 pg   MCHC 34.7 30.0 - 36.0 g/dL   RDW 78.2 95.6 - 21.3 %   Platelets 246 150 - 400 K/uL  Glucose, capillary   Collection Time: 08/09/16  2:37 PM  Result Value Ref Range   Glucose-Capillary 119 (H) 65 - 99 mg/dL  Glucose, capillary   Collection Time: 08/09/16  8:07 PM  Result Value Ref  Range   Glucose-Capillary 124 (H) 65 - 99 mg/dL    Imaging Studies:    08/08/2016 Impression  Monochorionic/diamniotic twin pregnancy at 31+5 weeks,  with IUGR of twin A  Normal amniotic fluid volume x 2  BPP 8/8 x 2  Dopplers on both twins show no absent or reversed end  disatolic flow ---------------------------------------------------------------------- Recommendations  Continue weekly evaluations as ordered  Medications:  Scheduled . [START ON 08/11/2016] amoxicillin  500 mg Oral Q8H  . aspirin EC  81 mg Oral QHS  . [START ON 08/11/2016] azithromycin  500 mg Oral Daily  . docusate sodium  100 mg Oral Daily  . ferrous sulfate  325 mg Oral QHS  . prenatal multivitamin  1 tablet Oral Q1200   I have reviewed the patient's current medications.  ASSESSMENT: Patient Active Problem List   Diagnosis Date Noted  . Amniotic fluid leaking 08/09/2016  . Prolonged premature rupture of membranes 08/09/2016  . Cervical shortening affecting pregnancy, antepartum 07/30/2016  . Gestational diabetes mellitus (GDM) affecting pregnancy 07/17/2016  . Supervision of high-risk pregnancy 07/04/2016  . Monochorionic diamniotic twin pregnancy 07/04/2016  . IUGR (intrauterine growth restriction) baby A affecting care of mother, third trimester, fetus 1 07/04/2016    PLAN: S/p BMZ x2 Pessary removed 08/09/2016 Continue atbx for latency Discontinue Magnesium sulfate Deliver at 34 weeks or sooner prn for maternal or fetal indications Cont  diet control of GDM  [facility] Harraway-Smith 08/10/2016,6:56 AM

## 2016-08-11 ENCOUNTER — Encounter (HOSPITAL_COMMUNITY): Payer: Self-pay | Admitting: General Practice

## 2016-08-11 DIAGNOSIS — O365931 Maternal care for other known or suspected poor fetal growth, third trimester, fetus 1: Secondary | ICD-10-CM

## 2016-08-11 DIAGNOSIS — O42113 Preterm premature rupture of membranes, onset of labor more than 24 hours following rupture, third trimester: Secondary | ICD-10-CM

## 2016-08-11 DIAGNOSIS — Z3A31 31 weeks gestation of pregnancy: Secondary | ICD-10-CM

## 2016-08-11 DIAGNOSIS — O24429 Gestational diabetes mellitus in childbirth, unspecified control: Secondary | ICD-10-CM

## 2016-08-11 DIAGNOSIS — O26873 Cervical shortening, third trimester: Secondary | ICD-10-CM

## 2016-08-11 DIAGNOSIS — O30033 Twin pregnancy, monochorionic/diamniotic, third trimester: Secondary | ICD-10-CM

## 2016-08-11 LAB — CULTURE, BETA STREP (GROUP B ONLY)

## 2016-08-11 LAB — RPR: RPR: NONREACTIVE

## 2016-08-11 LAB — GLUCOSE, CAPILLARY: Glucose-Capillary: 183 mg/dL — ABNORMAL HIGH (ref 65–99)

## 2016-08-11 MED ORDER — OXYCODONE HCL 5 MG PO TABS: 5.0000 mg | ORAL_TABLET | ORAL | Status: DC | PRN

## 2016-08-11 MED ORDER — DIBUCAINE 1 % RE OINT: 1.0000 "application " | TOPICAL_OINTMENT | RECTAL | Status: DC | PRN

## 2016-08-11 MED ORDER — IBUPROFEN 600 MG PO TABS
600.0000 mg | ORAL_TABLET | Freq: Four times a day (QID) | ORAL | Status: DC
  Administered 2016-08-11 – 2016-08-12 (×4): 600 mg via ORAL
  Filled 2016-08-11 (×4): qty 1

## 2016-08-11 MED ORDER — ONDANSETRON HCL 4 MG PO TABS: 4.0000 mg | ORAL_TABLET | ORAL | Status: DC | PRN

## 2016-08-11 MED ORDER — TETANUS-DIPHTH-ACELL PERTUSSIS 5-2.5-18.5 LF-MCG/0.5 IM SUSP: 0.5000 mL | Freq: Once | INTRAMUSCULAR | Status: DC

## 2016-08-11 MED ORDER — SIMETHICONE 80 MG PO CHEW: 80.0000 mg | CHEWABLE_TABLET | ORAL | Status: DC | PRN

## 2016-08-11 MED ORDER — ONDANSETRON HCL 4 MG/2ML IJ SOLN: 4.0000 mg | INTRAMUSCULAR | Status: DC | PRN

## 2016-08-11 MED ORDER — COCONUT OIL OIL: 1.0000 "application " | TOPICAL_OIL | Status: DC | PRN

## 2016-08-11 MED ORDER — WITCH HAZEL-GLYCERIN EX PADS: 1.0000 "application " | MEDICATED_PAD | CUTANEOUS | Status: DC | PRN

## 2016-08-11 MED ORDER — METHYLERGONOVINE MALEATE 0.2 MG/ML IJ SOLN: 0.2000 mg | INTRAMUSCULAR | Status: DC | PRN

## 2016-08-11 MED ORDER — OXYCODONE HCL 5 MG PO TABS
10.0000 mg | ORAL_TABLET | ORAL | Status: DC | PRN
Start: 2016-08-11 — End: 2016-08-12

## 2016-08-11 MED ORDER — ACETAMINOPHEN 325 MG PO TABS: 650.0000 mg | ORAL_TABLET | ORAL | Status: DC | PRN

## 2016-08-11 MED ORDER — METHYLERGONOVINE MALEATE 0.2 MG PO TABS: 0.2000 mg | ORAL_TABLET | ORAL | Status: DC | PRN

## 2016-08-11 MED ORDER — MEASLES, MUMPS & RUBELLA VAC ~~LOC~~ INJ
0.5000 mL | INJECTION | Freq: Once | SUBCUTANEOUS | Status: DC
  Filled 2016-08-11: qty 0.5

## 2016-08-11 MED ORDER — DIPHENHYDRAMINE HCL 25 MG PO CAPS
25.0000 mg | ORAL_CAPSULE | Freq: Four times a day (QID) | ORAL | Status: DC | PRN
Start: 2016-08-11 — End: 2016-08-12

## 2016-08-11 MED ORDER — BENZOCAINE-MENTHOL 20-0.5 % EX AERO: 1.0000 "application " | INHALATION_SPRAY | CUTANEOUS | Status: DC | PRN

## 2016-08-11 NOTE — Lactation Note (Signed)
This note was copied from a baby's chart. Lactation Consultation Note  Patient Name: Girl A Birder RobsonEmmery Mcmath Today's Date: 08/11/2016 Reason for consult: Initial assessment;Multiple gestation;NICU baby  Twins born at 5926w1d delivered vaginally 11 hrs ago.  Pump set up in room, GMOB stated Mom had pumped once and got a drop.  Mom in NICU again.   Left in room with GMOB, NICU brochure, Lactation brochure.  Encouraged GMOB to remind Mom to pump both breasts every 2-3 hrs on initiation setting.  Talked about breast massage and hand expression.  Reviewed pumping regime, and cleaning of pump parts.   GMOB to remind Mom to call for assistance as needed.   Lactation Tools Discussed/Used Pump Review: Setup, frequency, and cleaning Initiated by:: RN Date initiated:: 08/11/16   Consult Status Consult Status: Follow-up Date: 08/12/16 Follow-up type: In-patient    Judee ClaraSmith, Leone Putman E 08/11/2016, 2:36 PM

## 2016-08-11 NOTE — Progress Notes (Signed)
Reviewed pt. Orders. Called Joellyn HaffKim Booker, CNM to verify d/c of ante orders was appropriate, which she verified was appropriate. Ante orders d/c.

## 2016-08-11 NOTE — Progress Notes (Signed)
Transfer to L&D

## 2016-08-11 NOTE — Progress Notes (Signed)
Pt. Working on lactation, pumping and encouraged to continue to pump as normal, milk supply has not started yet.

## 2016-08-12 LAB — GLUCOSE, CAPILLARY: GLUCOSE-CAPILLARY: 97 mg/dL (ref 65–99)

## 2016-08-12 MED ORDER — IBUPROFEN 600 MG PO TABS: 600.0000 mg | ORAL_TABLET | Freq: Four times a day (QID) | ORAL | 0 refills | Status: DC

## 2016-08-12 NOTE — Discharge Instructions (Signed)

## 2016-08-12 NOTE — Lactation Note (Signed)
This note was copied from a baby's chart. Lactation Consultation Note  Patient Name: Andrea Romero ZOXWR'UToday's Date: 08/12/2016 Reason for consult: Follow-up assessment Babies at 31 hr of life. Mom is set for D/C today. She has a single electric breast pump for home use. [facility] and referral sent for DEBP. Reviewed hospital rental options. Mom will pump 8-12x/24hr.  She will use the hands on pumping technique and f/u with manual expression. She will label, store, and transport milk per hospital guidelines. She is aware of lactation services and support group. She will call as needed.   Maternal Data    Feeding    LATCH Score/Interventions                      Lactation Tools Discussed/Used     Consult Status Consult Status: Complete    Rulon Eisenmengerlizabeth E Rachna Schonberger 08/12/2016, 9:49 AM

## 2016-08-12 NOTE — Progress Notes (Signed)
Discharge teaching complete with pt. Pt understood all information and did not have any questions. Pt ambulated out of the hospital and discharged home to family.  

## 2016-08-12 NOTE — Discharge Summary (Signed)
Obstetric Discharge Summary Reason for Admission: PPROM Prenatal Procedures: none Intrapartum Procedures: spontaneous vaginal delivery Postpartum Procedures: none Complications-Operative and Postpartum: none Hemoglobin  Date Value Ref Range Status  08/10/2016 11.9 (L) 12.0 - 15.0 g/dL Final  16/10/960401/04/2016 54.012.5 g/dL Final   HCT  Date Value Ref Range Status  08/10/2016 34.9 (L) 36.0 - 46.0 % Final  03/27/2016 36 % Final   Hematocrit  Date Value Ref Range Status  07/16/2016 35.4 34.0 - 46.6 % Final    Physical Exam:  General: alert, cooperative and no distress Lochia: appropriate Uterine Fundus: firm  Discharge Diagnoses: Preterm Pregnancy- delivered  Discharge Information: Date: 08/12/2016 Activity: pelvic rest Diet: routine Medications: Ibuprofen Condition: stable Instructions: refer to practice specific booklet Discharge to: home Follow-up Information    [facility] Schedule an appointment as soon as possible for a visit in 4 week(s).   Why:  for postpartum appointment and [employer] Contact information: [address] [place] [id number] [phone]          Newborn Data:   Ellsworth LennoxLewis, Girl A Diamonique [[id number]][[id number]]  Live born female  Birth Weight: 2 lb 9.6 oz (1180 g) APGAR: 1, 6   Christell ConstantLewis, GirlB Lexany [[id number]][[id number]]  Live born female  Birth Weight: 4 lb 0.6 oz (1830 g) APGAR: 7, 8  Babies in NICU  Cleone SlimCaroline Neill SNM 08/12/2016, 7:54 AM

## 2016-08-13 ENCOUNTER — Encounter: Payer: Self-pay | Admitting: General Practice

## 2016-08-13 ENCOUNTER — Other Ambulatory Visit: Payer: Medicaid Other | Admitting: Obstetrics and Gynecology

## 2016-08-13 ENCOUNTER — Ambulatory Visit: Payer: Self-pay

## 2016-08-13 NOTE — Lactation Note (Signed)
This note was copied from a baby's chart. Lactation Consultation Note  Patient Name: Andrea Romero LKGMW'NToday's Date: 08/13/2016 Reason for consult: Follow-up assessment;NICU baby;Multiple gestation;Infant < 6lbs   Follow up with mom of 9359 hour old twins. Mom reports her milk is increasing. She reports she is using a DEBP at home that is not working as well. [facility] [employer] [facility] . She reports she has no questions/concerns at this time.    Maternal Data    Feeding Feeding Type: Donor Breast Milk Length of feed: 30 min  LATCH Score/Interventions                      Lactation Tools Discussed/Used WIC Program: Yes   Consult Status Consult Status: PRN Follow-up type: Call as needed    Ed BlalockSharon S Hice 08/13/2016, 2:10 PM

## 2016-08-15 ENCOUNTER — Ambulatory Visit (HOSPITAL_COMMUNITY): Payer: Medicaid Other

## 2016-08-16 ENCOUNTER — Other Ambulatory Visit: Payer: Medicaid Other | Admitting: Obstetrics & Gynecology

## 2016-08-22 ENCOUNTER — Ambulatory Visit (HOSPITAL_COMMUNITY): Payer: Medicaid Other

## 2016-08-31 ENCOUNTER — Ambulatory Visit: Payer: Self-pay

## 2016-08-31 NOTE — Lactation Note (Signed)
This note was copied from a baby's chart. Lactation Consultation Note  Patient Name: Andrea Romero Reason for consult: Follow-up assessment;NICU baby;Multiple gestation  NICU twins 262 weeks old. Mom reports that she put Baby Girl B to breast for the first time last night, but she did not really latch. Assisted mom with positioning, and attempting to latch baby to left breast in cross-cradle position. Baby quiet alert and mouthing mom's breast. Discussed with mom that it is progress for baby to be willing to mouth and lick at breast, and enc mom to continue to let baby stay at the breast for a while. Discussed with mom that if baby not progressing with a few more attempts, mom can be fitted with a NS and this may help with latching. Discussed the benefits of having the baby's mouth directly on the breast for now. Mom reports that she is happy just to have the baby at the breast.  Mom reports that pumping is going fine. Enc mom to pump after having baby at the breast.   Maternal Data Has patient been taught Hand Expression?: Yes Does the patient have breastfeeding experience prior to this delivery?: No  Feeding Feeding Type: Breast Fed Length of feed: 0 min  LATCH Score/Interventions Latch: Too sleepy or reluctant, no latch achieved, no sucking elicited. Intervention(s): Skin to skin;Teach feeding cues;Waking techniques Intervention(s): Adjust position;Assist with latch;Breast compression  Audible Swallowing: None Intervention(s): Skin to skin;Hand expression  Type of Nipple: Everted at rest and after stimulation  Comfort (Breast/Nipple): Soft / non-tender     Hold (Positioning): Assistance needed to correctly position infant at breast and maintain latch. Intervention(s): Breastfeeding basics reviewed;Support Pillows;Position options;Skin to skin  LATCH Score: 5  Lactation Tools Discussed/Used     Consult Status Consult Status: PRN    Sherlyn HayJennifer D  Delbra Zellars Romero, 9:00 PM

## 2016-09-17 ENCOUNTER — Encounter: Payer: Self-pay | Admitting: Advanced Practice Midwife

## 2016-09-17 ENCOUNTER — Ambulatory Visit (INDEPENDENT_AMBULATORY_CARE_PROVIDER_SITE_OTHER): Payer: Medicaid Other | Admitting: Advanced Practice Midwife

## 2016-09-17 ENCOUNTER — Other Ambulatory Visit (HOSPITAL_COMMUNITY)
Admission: RE | Admit: 2016-09-17 | Discharge: 2016-09-17 | Disposition: A | Discharge status: Home / Self Care | Payer: Medicaid Other | Source: Ambulatory Visit | Attending: Obstetrics & Gynecology | Admitting: Obstetrics & Gynecology

## 2016-09-17 ENCOUNTER — Ambulatory Visit: Payer: Self-pay

## 2016-09-17 DIAGNOSIS — Z01419 Encounter for gynecological examination (general) (routine) without abnormal findings: Secondary | ICD-10-CM | POA: Diagnosis not present

## 2016-09-17 DIAGNOSIS — O24419 Gestational diabetes mellitus in pregnancy, unspecified control: Secondary | ICD-10-CM

## 2016-09-17 MED ORDER — NORETHINDRONE 0.35 MG PO TABS: 1.0000 | ORAL_TABLET | Freq: Every day | ORAL | 11 refills | Status: DC

## 2016-09-17 NOTE — Lactation Note (Signed)
This note was copied from a baby's chart. Lactation Consultation Note  Patient Name: Andrea Romero UJWJX'BToday's Date: 09/17/2016 Reason for consult: Follow-up assessment;NICU baby;Multiple gestation   Mom wanted LC to come to NICU to watch [name] latch.  Infant is 37 weeks adjusted age. (Born 6732.701 GA, 205 weeks old).  Infant attempted to latch but could not latch onto breast.  Infant has been sucking on pacifier, bottle, and easily sucked on gloved finger. #16 NS applied and mom re-latched infant with minimal assistance.  Infant easily latched took a few sucks and maintained latched.   After a few minutes of intermittent sucks, infant began to suck in a slow but consistent pattern.  Milk noted in nipple shield when infant came off.  Infant was relaxed and content at breast with latching and sucking.   Mom only needed minimal assistance with initial latching with nipple shield and with re-latching infant, mom was able to independently latch infant.   Taught mom how to sandwich breast for support, and how to do chin tug to assure flanging of bottom lip.  Taught mom how to apply and clean nipple shield.  Infant was still latched 15 minutes later when [facility] left bedside.  LS-9.   Discussed with RN infant's latching and sucking.  RN was going to NG tube feed infant while infant was at breast which would allow infant to associate being at breast with a full belly.   Mom reports she is pumping every 3 hrs with infants' feeding schedules.  Reports she is pumping 3-4 oz with each session.   Mom reports she plans to try infant "A" at breast for first time tomorrow.  Infant "A" has not been feeding via bottle yet.   Extra #16 nipple shield given to mom since she has twins and plans to try [name] tomorrow.  Encouraged her to call for assistance as needed.    Mom was very satisfied with latching progress with infant "B" tonight.        Feeding Feeding Type: Breast Milk Length of feed: 45 min  LATCH  Score/Interventions Latch: Grasps breast easily, tongue down, lips flanged, rhythmical sucking. (with NS #16) Intervention(s): Assist with latch;Breast compression  Audible Swallowing: A few with stimulation Intervention(s): Hand expression  Type of Nipple: Everted at rest and after stimulation  Comfort (Breast/Nipple): Soft / non-tender     Hold (Positioning): Assistance needed to correctly position infant at breast and maintain latch. (mom only needed minimal assistance) Intervention(s): Breastfeeding basics reviewed;Support Pillows  LATCH Score: 8  Lactation Tools Discussed/Used Tools: Nipple Shields Nipple shield size: 16   Consult Status Consult Status: PRN    Andrea Romero, Andrea Romero 09/17/2016, 6:58 PM

## 2016-09-17 NOTE — Patient Instructions (Signed)
Oral Contraception Use Oral contraceptive pills (OCPs) are medicines taken to prevent pregnancy. OCPs work by preventing the ovaries from releasing eggs. The hormones in OCPs also cause the cervical mucus to thicken, preventing the sperm from entering the uterus. The hormones also cause the uterine lining to become thin, not allowing a fertilized egg to attach to the inside of the uterus. OCPs are highly effective when taken exactly as prescribed. However, OCPs do not prevent sexually transmitted diseases (STDs). Safe sex practices, such as using condoms along with an OCP, can help prevent STDs. Before taking OCPs, you may have a physical exam and Pap test. Your health care provider may also order blood tests if necessary. Your health care provider will make sure you are a good candidate for oral contraception. Discuss with your health care provider the possible side effects of the OCP you may be prescribed. When starting an OCP, it can take 2 to 3 months for the body to adjust to the changes in hormone levels in your body. How to take oral contraceptive pills Your health care provider may advise you on how to start taking the first cycle of OCPs. Otherwise, you can:  Start on day 1 of your menstrual period. You will not need any backup contraceptive protection with this start time.  Start on the first Sunday after your menstrual period or the day you get your prescription. In these cases, you will need to use backup contraceptive protection for the first week.  Start the pill at any time of your cycle. If you take the pill within 5 days of the start of your period, you are protected against pregnancy right away. In this case, you will not need a backup form of birth control. If you start at any other time of your menstrual cycle, you will need to use another form of birth control for 7 days. If your OCP is the type called a minipill, it will protect you from pregnancy after taking it for 2 days (48  hours).  After you have started taking OCPs:  If you forget to take 1 pill, take it as soon as you remember. Take the next pill at the regular time.  If you miss 2 or more pills, call your health care provider because different pills have different instructions for missed doses. Use backup birth control until your next menstrual period starts.  If you use a 28-day pack that contains inactive pills and you miss 1 of the last 7 pills (pills with no hormones), it will not matter. Throw away the rest of the non-hormone pills and start a new pill pack.  No matter which day you start the OCP, you will always start a new pack on that same day of the week. Have an extra pack of OCPs and a backup contraceptive method available in case you miss some pills or lose your OCP pack. Follow these instructions at home:  Do not smoke.  Always use a condom to protect against STDs. OCPs do not protect against STDs.  Use a calendar to mark your menstrual period days.  Read the information and directions that came with your OCP. Talk to your health care provider if you have questions. Contact a health care provider if:  You develop nausea and vomiting.  You have abnormal vaginal discharge or bleeding.  You develop a rash.  You miss your menstrual period.  You are losing your hair.  You need treatment for mood swings or depression.  You   get dizzy when taking the OCP.  You develop acne from taking the OCP.  You become pregnant. Get help right away if:  You develop chest pain.  You develop shortness of breath.  You have an uncontrolled or severe headache.  You develop numbness or slurred speech.  You develop visual problems.  You develop pain, redness, and swelling in the legs. This information is not intended to replace advice given to you by your health care provider. Make sure you discuss any questions you have with your health care provider. Document Released: 03/01/2011 Document  Revised: 08/18/2015 Document Reviewed: 08/31/2012 Elsevier Interactive Patient Education  2017 [employer]  

## 2016-09-17 NOTE — Progress Notes (Signed)
Subjective:     Andrea Romero is a 26 y.o. female who presents for a postpartum visit. She is 5 weeks postpartum following a spontaneous vaginal delivery. I have fully reviewed the prenatal and intrapartum course. The delivery was at 32 gestational weeks. Outcome: spontaneous vaginal delivery. Anesthesia: none. Postpartum course has been uneventful. Baby's course has been routine per NICU. Baby is feeding by breast. Bleeding staining only. Bowel function is normal. Bladder function is normal. Patient is sexually active. Contraception method is none. Postpartum depression screening: negative.  The following portions of the patient's history were reviewed and updated as appropriate: allergies, current medications, past family history, past medical history, past social history, past surgical history and problem list.  Review of Systems Pertinent items are noted in HPI.   Objective:    BP 112/68   Pulse 86   Ht 4\' 11"  (1.499 m)   Wt 121 lb 3.2 oz (55 kg)   BMI 24.48 kg/m   General:  alert, cooperative and no distress   Breasts:  inspection negative, no nipple discharge or bleeding, no masses or nodularity palpable  Lungs: clear to auscultation bilaterally  Heart:  regular rate and rhythm, S1, S2 normal, no murmur, click, rub or gallop  Abdomen: soft, non-tender; bowel sounds normal; no masses,  no organomegaly   Vulva:  normal  Vagina: normal vagina, no discharge, exudate, lesion, or erythema  Cervix:  no bleeding following Pap and no cervical motion tenderness  Corpus: normal size, contour, position, consistency, mobility, non-tender  Adnexa:  no mass, fullness, tenderness  Rectal Exam: Not performed.        Assessment:     Normal postpartum exam. Pap smear done at today's visit.   Plan:    1. Contraception: oral progesterone-only contraceptive 2. Discussed how to take pills.  May change after weaning to Combined 3. Follow up in: 1 month for BP check or as needed.

## 2016-09-18 ENCOUNTER — Telehealth: Payer: Self-pay

## 2016-09-18 LAB — CYTOLOGY - PAP: DIAGNOSIS: NEGATIVE

## 2016-09-18 NOTE — Telephone Encounter (Signed)
Called patient explain the need to come back in for a postpartum 2 hour gtt. Patient stated she will call back to scheduled this. I have explained to patient how the 2 hour gtt will be perform and she should have nothing to eat or drink after midnight prior to the morning she comes in.

## 2016-09-18 NOTE — Telephone Encounter (Signed)
-----   Message from Aviva SignsMarie L Williams, CNM sent at 09/17/2016  5:21 PM EDT ----- Regarding: Got so busy forgot to order her glucose test for PP Was diabetic with twins  Needs PP glucose test  Thanks Hilda LiasMarie

## 2016-09-24 ENCOUNTER — Ambulatory Visit: Payer: Self-pay

## 2016-09-24 NOTE — Lactation Note (Signed)
This note was copied from a baby's chart. Lactation Consultation Note  Patient Name: Andrea Romero ZOXWR'UToday's Date: 09/24/2016 Reason for consult: Follow-up assessment;NICU baby;Multiple gestation  NICU twin "B" 656 weeks old. Mom left NS at home, given #16. Assisted with application of NS, and baby latched deeply and maintained a deep latch, suckling rhythmically with some swallows noted. Mom reports that she nurses baby "B' 1-3 times/day and is able to see milk in NS while and after baby nurses. Mom states that she has attempted to nurse [name], but "she is just not ready yet. Enc mom to keep attempting as often as she and babies are able and to call for assistance as needed.   Maternal Data    Feeding Feeding Type: Breast Fed Nipple Type: Dr. Levert FeinsteinBrowns Ultra Preemie Length of feed:  (LC assessed first few minutes of BF.)  LATCH Score/Interventions Latch: Grasps breast easily, tongue down, lips flanged, rhythmical sucking. (With #16 NS.)  Audible Swallowing: A few with stimulation  Type of Nipple: Everted at rest and after stimulation  Comfort (Breast/Nipple): Soft / non-tender     Hold (Positioning): No assistance needed to correctly position infant at breast.  LATCH Score: 9  Lactation Tools Discussed/Used Tools: Nipple Shields Nipple shield size: 16   Consult Status Consult Status: PRN    Sherlyn HayJennifer D Karmah Potocki 09/24/2016, 9:27 AM

## 2016-09-27 ENCOUNTER — Ambulatory Visit: Payer: Self-pay

## 2016-09-27 NOTE — Lactation Note (Signed)
This note was copied from a baby's chart. Lactation Consultation Note  Patient Name: Christell ConstantGirlB Miakoda Salek ZOXWR'UToday's Date: 09/27/2016 Reason for consult: Follow-up assessment;NICU baby  Baby girl "B" 206 weeks old. Parents roomed in with baby in NICU last night and are still in bed this morning. Mom reports that she put baby to breast first with each feeding and then supplemented afterwards with EBM. Mom knows to continue pumping and supplementing with EBM, especially while using NS. Mom declines assistance at this time. Mom aware of OP/BFSG and LC phone line assistance after D/C.   Maternal Data    Feeding Feeding Type: Breast Milk Nipple Type: Dr. Lorne SkeensBrowns Preemie  [facility] Score/Interventions                      Lactation Tools Discussed/Used     Consult Status Consult Status: PRN    Sherlyn HayJennifer D Krishawna Stiefel 09/27/2016, 11:14 AM

## 2016-10-15 ENCOUNTER — Ambulatory Visit: Payer: Medicaid Other

## 2017-07-08 IMAGING — US US MFM UA ADDL GEST RE-EVAL
1 series · 13 of 28 positions shown · non-contrast
Comparison: none

[Series 1: us mfm ua addl gest re-eval · 68 acquisitions, 13 frames shown]
[im 3/68]
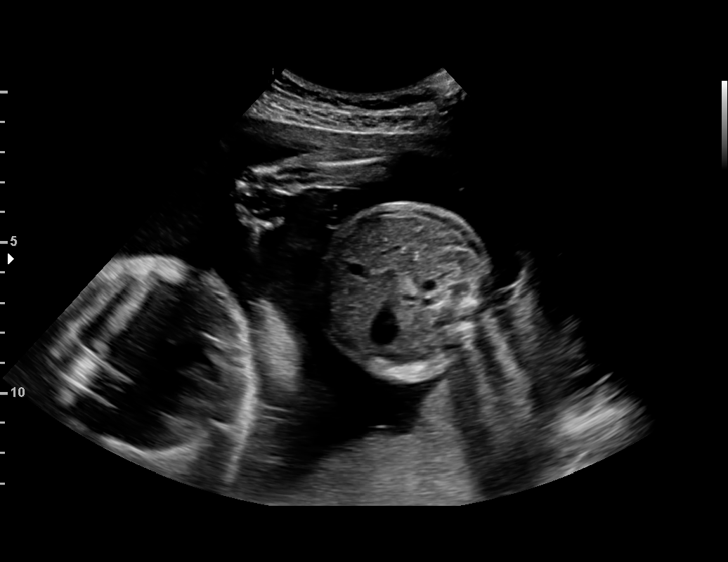
[im 8/68]
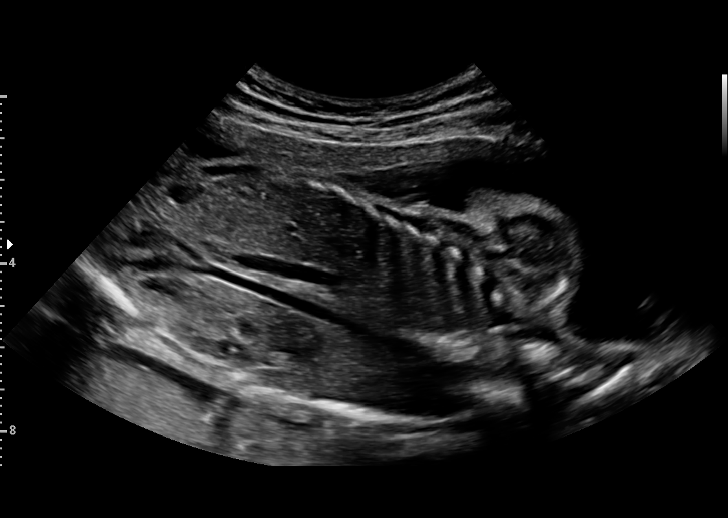
[im 13/68]
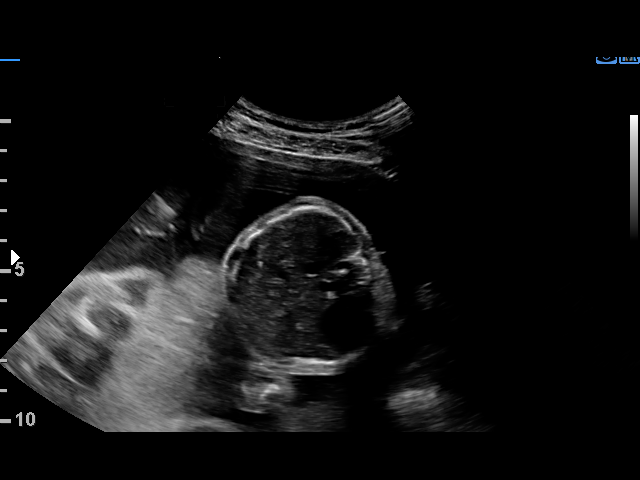
[im 18/68]
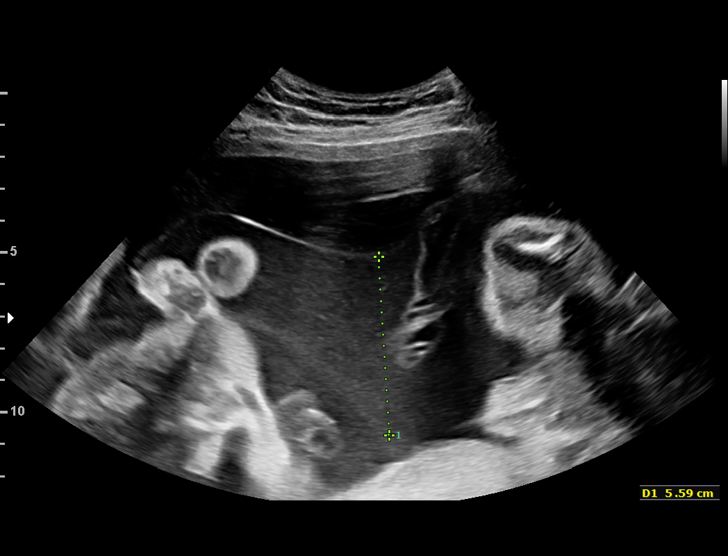
[im 23/68]
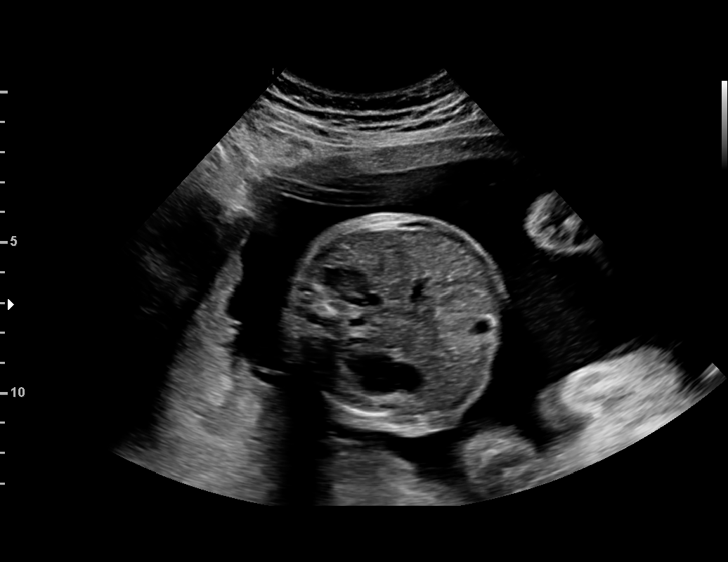
[im 28/68]
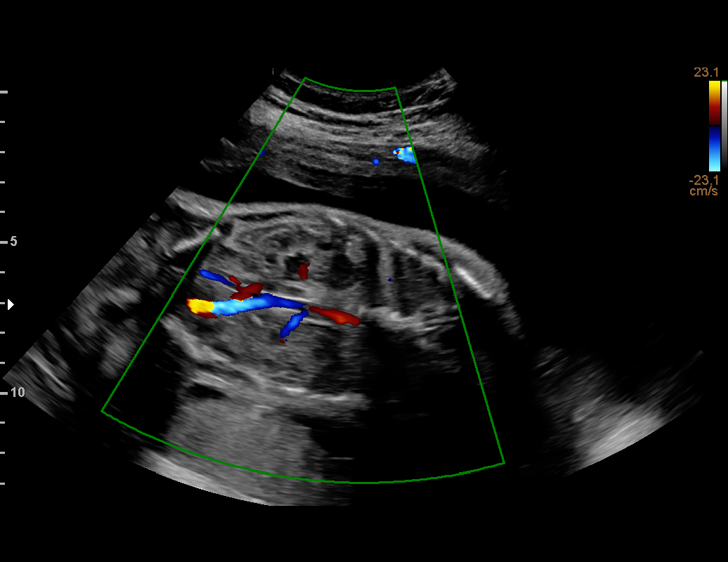
[im 35/68]
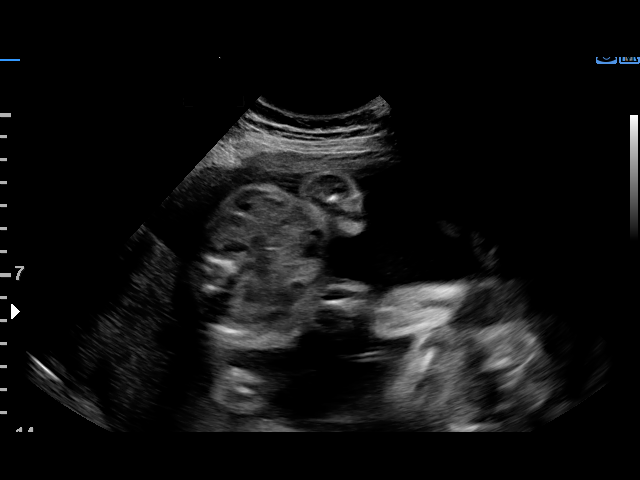
[im 40/68]
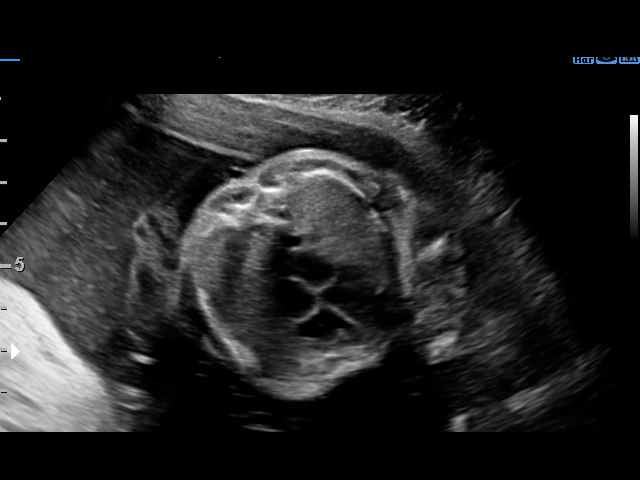
[im 45/68]
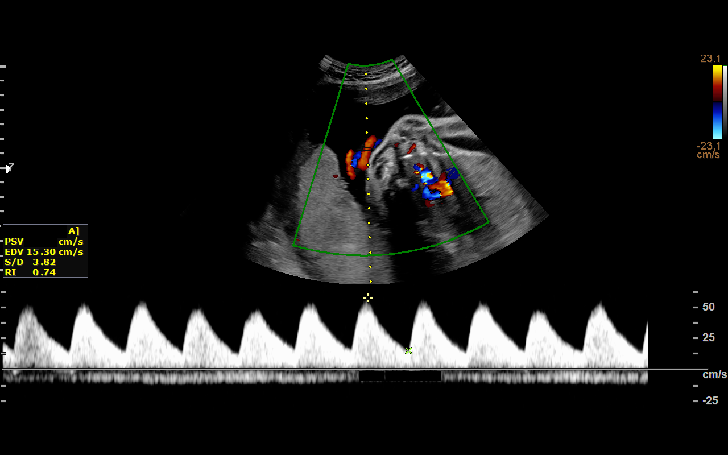
[im 50/68]
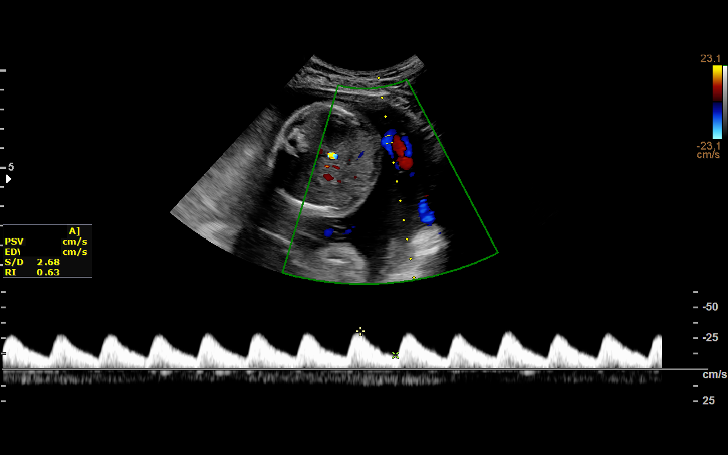
[im 55/68]
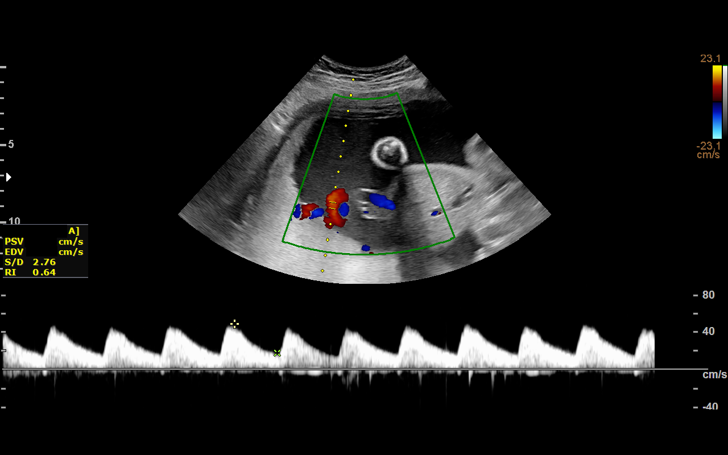
[im 60/68]
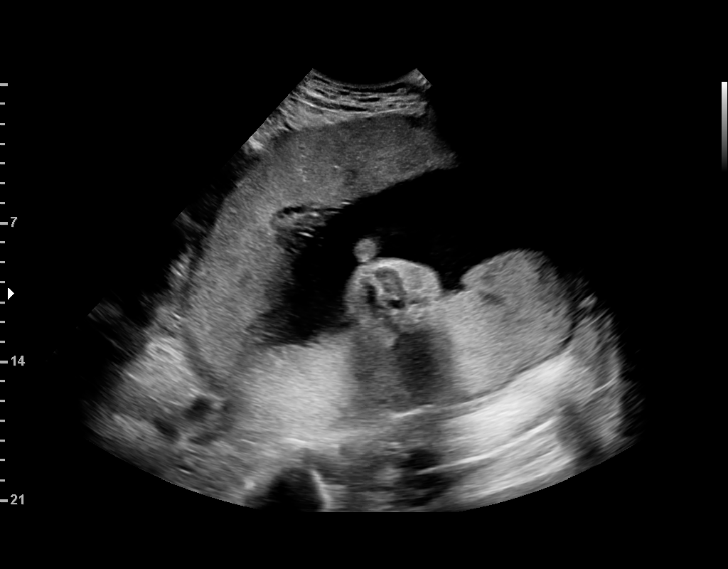
[im 65/68]
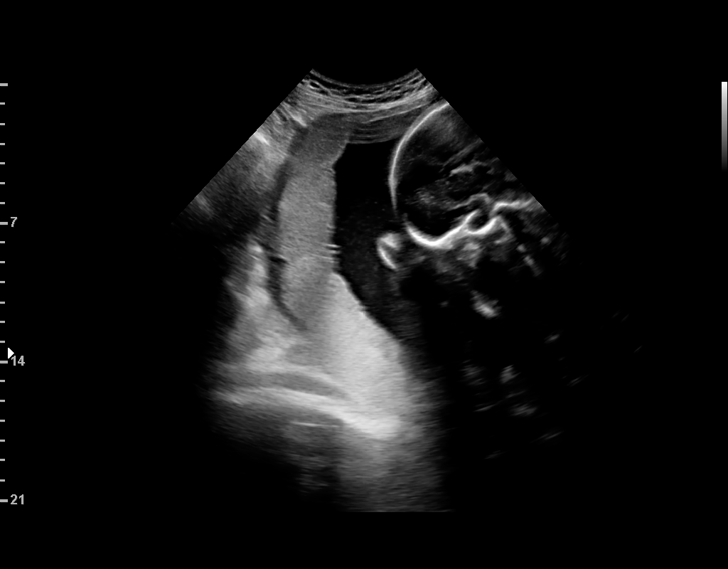

[13 of 28 positions shown; findings below may reference images not displayed]

GESTATION
EVAL

1  BODOOR RAS            [id number]      [id number]     [id number]
2  BODOOR RAS            [id number]      [id number]     [id number]
3  BODOOR RAS            [id number]      [id number]     [id number]
4  BODOOR RAS            [id number]      [id number]     [id number]
Indications

26 weeks gestation of pregnancy
Twin pregnancy, Gaffari/Besoo, second trimester
Maternal care for known or suspected poor
fetal growth, second trimester, fetus 1
Encounter for other antenatal screening
follow-up
OB History

Gravidity:    3         Term:   0        Prem:   0        SAB:   2
TOP:          0       Ectopic:  0        Living: 0
Fetal Evaluation (Fetus A)

Num Of Fetuses:     2
Fetal Heart         133
Rate(bpm):
Cardiac Activity:   Observed
Fetal Lie:          Left Fetus
Presentation:       Cephalic
Placenta:           Posterior, above cervical os
Amniotic Fluid
AFI FV:      Subjectively within normal limits

Largest Pocket(cm)
5.59
Biophysical Evaluation (Fetus A)

Amniotic F.V:   Within normal limits       F. Tone:        Observed
F. Movement:    Observed                   Score:          [DATE]
F. Breathing:   Observed
Gestational Age (Fetus A)

LMP:           26w 5d       Date:   12/30/15                 EDD:   10/05/16
Best:          26w 5d    Det. By:   LMP  (12/30/15)          EDD:   10/05/16
Anatomy (Fetus A)

Heart:                 Appears normal         Kidneys:                Appear normal
(4CH, axis, and situs
Stomach:               Appears normal, left   Bladder:                Appears normal
sided
Doppler - Fetal Vessels (Fetus A)

Umbilical Artery
S/D     %tile                                     PSV
(cm/s)
2.[REDACTED]

Fetal Evaluation (Fetus B)

Num Of Fetuses:     2
Fetal Heart         137
Rate(bpm):
Cardiac Activity:   Observed
Presentation:       Transverse, head to maternal left
Placenta:           Posterior, above cervical os

Amniotic Fluid
AFI FV:      Subjectively within normal limits

Largest Pocket(cm)
6.85
Biophysical Evaluation (Fetus B)

Amniotic F.V:   Within normal limits       F. Tone:        Observed
F. Movement:    Observed                   Score:          [DATE]
F. Breathing:   Observed
Gestational Age (Fetus B)

LMP:           26w 5d       Date:   12/30/15                 EDD:   10/05/16
Best:          26w 5d    Det. By:   LMP  (12/30/15)          EDD:   10/05/16
Doppler - Fetal Vessels (Fetus B)

Umbilical Artery
S/D     %tile                                     PSV
(cm/s)
2.[REDACTED]

Impression

MC/DA  twin gestation at 26w 5d
Lagging growth twin A (EFW at 14th %tile with AC < 3rd on
[DATE]).  Appropriate growth of Twin B.

Twin A:
Maternal left, cephalic, posterior placenta
BPP [DATE]
UA Doppler studies normal for gestational age
Normal bladder
Normal amniotic fluid volume  (MVP 5.59 cm)

Twin B:
Transverse lie with fetal head to maternal left, posterior
placenta
BPP [DATE]
UA Doppler studies normal for gestational age
Normal bladder
Normal amniotic fluid volume (MVP 6.8 cm)
Recommendations

Ultrasound for interval growth next week
Continue weekly BPPs, UA Doppler studies as scheduled

## 2018-03-05 DIAGNOSIS — Z3009 Encounter for other general counseling and advice on contraception: Secondary | ICD-10-CM | POA: Diagnosis not present

## 2018-03-05 DIAGNOSIS — Z0000 Encounter for general adult medical examination without abnormal findings: Secondary | ICD-10-CM | POA: Diagnosis not present

## 2018-03-05 DIAGNOSIS — Z01419 Encounter for gynecological examination (general) (routine) without abnormal findings: Secondary | ICD-10-CM | POA: Diagnosis not present

## 2018-03-05 DIAGNOSIS — Z113 Encounter for screening for infections with a predominantly sexual mode of transmission: Secondary | ICD-10-CM | POA: Diagnosis not present

## 2019-03-27 NOTE — L&D Delivery Note (Addendum)
OB/GYN Faculty Practice Delivery Note  Andrea Romero is a 29 y.o. [id number] s/p NSVD at [redacted]w[redacted]d. She was admitted for labor management.  ROM: 4h 68m with clear fluid GBS Status: NEGATIVE/-- (09/21 0444) Maximum Maternal Temperature: 98.2 F  Labor Progress: . Patient presented to L&D for labor management. Initial SVE: 3/90/ballotable and vertex. Labor course was uncomplicated. She then progressed to complete s/p AROM for clear fluid.  Delivery Date/Time: 12/15/19 @ 14:55  Delivery: Called to room and patient was complete and started pushing. Head position was ROA and delivered with ease over the perineum. No nuchal cord present. Shoulder and body delivered in usual fashion. Infant with spontaneous cry, placed on mother's abdomen, dried and stimulated. Cord clamped x 2 after 1-minute delay, and cut by scissors. Cord blood drawn. Placenta delivered spontaneously with gentle cord traction. Fundus firm with massage and pitocin started. Labia, perineum, vagina, and cervix inspected and significant for hemostatic right periurethral laceration.  Baby Weight: 3260g  Cord: central insertion, 3 vessel Placenta: Sent to L&D Complications: None Lacerations: hemostatic right periurethral laceration without repair EBL: 350 cc Analgesia: Epidural   Infant: viable female APGAR (1 MIN): 9  APGAR (5 MINS): 9   Herby Abraham MD, PGY-1 OBGYN Faculty Teaching Service  12/15/2019, 3:24 PM  I was gloved and present for delivery of infant and placenta.  [name], MD OB Fellow, Faculty Practice 12/15/2019 5:38 PM

## 2019-06-03 ENCOUNTER — Encounter: Payer: Self-pay | Admitting: General Practice

## 2019-06-15 ENCOUNTER — Ambulatory Visit (INDEPENDENT_AMBULATORY_CARE_PROVIDER_SITE_OTHER): Payer: Medicaid Other | Admitting: *Deleted

## 2019-06-15 VITALS — Wt 115.0 lb

## 2019-06-15 DIAGNOSIS — Z8632 Personal history of gestational diabetes: Secondary | ICD-10-CM | POA: Insufficient documentation

## 2019-06-15 DIAGNOSIS — Z87898 Personal history of other specified conditions: Secondary | ICD-10-CM | POA: Insufficient documentation

## 2019-06-15 DIAGNOSIS — O09899 Supervision of other high risk pregnancies, unspecified trimester: Secondary | ICD-10-CM | POA: Insufficient documentation

## 2019-06-15 DIAGNOSIS — Z3480 Encounter for supervision of other normal pregnancy, unspecified trimester: Secondary | ICD-10-CM | POA: Insufficient documentation

## 2019-06-15 DIAGNOSIS — O09299 Supervision of pregnancy with other poor reproductive or obstetric history, unspecified trimester: Secondary | ICD-10-CM

## 2019-06-15 HISTORY — DX: Personal history of gestational diabetes: Z86.32

## 2019-06-15 MED ORDER — GOJJI WEIGHT SCALE MISC: 1.0000 | Freq: Every day | 0 refills | Status: DC | PRN

## 2019-06-15 NOTE — Progress Notes (Signed)
  Virtual Visit via Telephone Note  I connected with Andrea Romero on 06/15/19 at  8:30 AM EDT by telephone and verified that I am speaking with the correct person using two identifiers.  Location: Patient: Andrea Romero MRN: [id number] Provider: Clovis Pu, RN   I discussed the limitations, risks, security and privacy concerns of performing an evaluation and management service by telephone and the availability of in person appointments. I also discussed with the patient that there may be a patient responsible charge related to this service. The patient expressed understanding and agreed to proceed.   History of Present Illness: PRENATAL INTAKE SUMMARY  Andrea Romero presents today New OB Nurse Interview.  OB History    Gravida  4   Para  1   Term      Preterm  1   AB  2   Living  2     SAB  2   TAB      Ectopic      Multiple  1   Live Births  2          I have reviewed the patient's medical, obstetrical, social, and family histories, medications, and available lab results.  SUBJECTIVE She complains of feeling depressed.   Observations/Objective: Initial nurse interview for history/labs (New OB)  EDD: 01/04/20 by LMP GA: [redacted]w[redacted]d [id number] FHT: non face to face interview  GENERAL APPEARANCE: non face to face interview  Assessment and Plan: Normal pregnancy Prenatal care-[facility] to be completed at next visit with midwife PHQ-9 score: 13; appointment scheduled with CSW 06/17/19 Appointment with [name], CNM 07/02/19 Rx for weight scale sent to [facility] Continue PNV Patient has BP monitor at home Patient sign up for babyscripts   Follow Up Instructions:   I discussed the assessment and treatment plan with the patient. The patient was provided an opportunity to ask questions and all were answered. The patient agreed with the plan and demonstrated an understanding of the instructions.   The patient was advised to call back or seek  an in-person evaluation if the symptoms worsen or if the condition fails to improve as anticipated.  I provided 20 minutes of non-face-to-face time during this encounter.   Clovis Pu, RN

## 2019-06-17 ENCOUNTER — Ambulatory Visit (INDEPENDENT_AMBULATORY_CARE_PROVIDER_SITE_OTHER): Payer: Medicaid Other | Admitting: Licensed Clinical Social Worker

## 2019-06-17 ENCOUNTER — Other Ambulatory Visit: Payer: Self-pay

## 2019-06-17 DIAGNOSIS — F418 Other specified anxiety disorders: Secondary | ICD-10-CM

## 2019-06-17 DIAGNOSIS — O99340 Other mental disorders complicating pregnancy, unspecified trimester: Secondary | ICD-10-CM | POA: Diagnosis not present

## 2019-06-17 DIAGNOSIS — O99341 Other mental disorders complicating pregnancy, first trimester: Secondary | ICD-10-CM

## 2019-06-17 DIAGNOSIS — F329 Major depressive disorder, single episode, unspecified: Secondary | ICD-10-CM

## 2019-06-17 DIAGNOSIS — Z3A00 Weeks of gestation of pregnancy not specified: Secondary | ICD-10-CM

## 2019-06-17 DIAGNOSIS — F419 Anxiety disorder, unspecified: Secondary | ICD-10-CM

## 2019-06-17 NOTE — BH Specialist Note (Signed)
Integrated Behavioral Health Initial Visit  MRN: [id number] Name: Andrea Romero  Number of Integrated Behavioral Health Clinician visits:: 1 Session Start time: 10:00am  Session End time: 10:19am Total time: 19 mins   Type of Service: Integrated Behavioral Health- Individual Interpretor:no  Interpretor Name and Language: none    Warm Hand Off Completed.       SUBJECTIVE: Andrea Romero is a 29 y.o. 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 Reduce symptoms of: depression and anxiety  2. Increase knowledge and/or ability of: identify triggers impacting Andrea Romero depression and anxiety symptoms.  3. Demonstrate ability to: self manage symptoms   INTERVENTIONS: Interventions utilized: psychoeducation and supportive counseling   Standardized Assessments completed: 06/15/2019  ASSESSMENT: Patient currently experiencing depression affecting pregnancy and anxiety. Andrea Romero reports depression symptoms began approximately two years ago however she never sought services. According to Andrea Romero, she is worries a lot something bad may happen which impacts her ability to concentrate. Andrea Romero reports she has a hard time getting out of bed and keeping up with daily task.    Patient may benefit from integrated behavioral health   PLAN: 1. Follow up with behavioral health clinician on : 07/02/2019 during new ob visit  2. Behavioral recommendations: prioritize sleep, increase social interactions, no electronics before bedtime and encourage  stress relieving activities  3. Referral(s): none  4. "From scale of 1-10, how likely are you to follow plan?":   Gwyndolyn Saxon, LCSW

## 2019-06-18 DIAGNOSIS — O0990 Supervision of high risk pregnancy, unspecified, unspecified trimester: Secondary | ICD-10-CM | POA: Diagnosis not present

## 2019-07-02 ENCOUNTER — Other Ambulatory Visit (HOSPITAL_COMMUNITY)
Admission: RE | Admit: 2019-07-02 | Discharge: 2019-07-02 | Disposition: A | Discharge status: Home / Self Care | Payer: Medicaid Other | Source: Ambulatory Visit | Attending: Obstetrics and Gynecology | Admitting: Obstetrics and Gynecology

## 2019-07-02 ENCOUNTER — Encounter: Payer: Self-pay | Admitting: Obstetrics and Gynecology

## 2019-07-02 ENCOUNTER — Other Ambulatory Visit: Payer: Self-pay

## 2019-07-02 ENCOUNTER — Ambulatory Visit: Payer: Medicaid Other | Admitting: Licensed Clinical Social Worker

## 2019-07-02 ENCOUNTER — Ambulatory Visit (INDEPENDENT_AMBULATORY_CARE_PROVIDER_SITE_OTHER): Payer: Medicaid Other | Admitting: Obstetrics and Gynecology

## 2019-07-02 VITALS — BP 99/61 | HR 77 | Temp 98.0°F | Wt 114.0 lb

## 2019-07-02 DIAGNOSIS — Z3482 Encounter for supervision of other normal pregnancy, second trimester: Secondary | ICD-10-CM | POA: Diagnosis not present

## 2019-07-02 DIAGNOSIS — Z8751 Personal history of pre-term labor: Secondary | ICD-10-CM | POA: Diagnosis not present

## 2019-07-02 DIAGNOSIS — Z3480 Encounter for supervision of other normal pregnancy, unspecified trimester: Secondary | ICD-10-CM | POA: Insufficient documentation

## 2019-07-02 DIAGNOSIS — O99342 Other mental disorders complicating pregnancy, second trimester: Secondary | ICD-10-CM | POA: Diagnosis not present

## 2019-07-02 DIAGNOSIS — O09212 Supervision of pregnancy with history of pre-term labor, second trimester: Secondary | ICD-10-CM

## 2019-07-02 DIAGNOSIS — F329 Major depressive disorder, single episode, unspecified: Secondary | ICD-10-CM

## 2019-07-02 DIAGNOSIS — O09892 Supervision of other high risk pregnancies, second trimester: Secondary | ICD-10-CM

## 2019-07-02 DIAGNOSIS — O09292 Supervision of pregnancy with other poor reproductive or obstetric history, second trimester: Secondary | ICD-10-CM | POA: Diagnosis not present

## 2019-07-02 DIAGNOSIS — O09899 Supervision of other high risk pregnancies, unspecified trimester: Secondary | ICD-10-CM

## 2019-07-02 DIAGNOSIS — Z3A13 13 weeks gestation of pregnancy: Secondary | ICD-10-CM

## 2019-07-02 DIAGNOSIS — Z124 Encounter for screening for malignant neoplasm of cervix: Secondary | ICD-10-CM | POA: Insufficient documentation

## 2019-07-02 DIAGNOSIS — Z87898 Personal history of other specified conditions: Secondary | ICD-10-CM | POA: Diagnosis not present

## 2019-07-02 DIAGNOSIS — F32A Depression, unspecified: Secondary | ICD-10-CM

## 2019-07-02 DIAGNOSIS — Z8632 Personal history of gestational diabetes: Secondary | ICD-10-CM | POA: Diagnosis not present

## 2019-07-02 DIAGNOSIS — O09299 Supervision of pregnancy with other poor reproductive or obstetric history, unspecified trimester: Secondary | ICD-10-CM | POA: Diagnosis not present

## 2019-07-02 MED ORDER — SERTRALINE HCL 25 MG PO TABS: 25.0000 mg | ORAL_TABLET | Freq: Every day | ORAL | 3 refills | Status: DC

## 2019-07-02 NOTE — Patient Instructions (Signed)
Perinatal Depression When a woman feels excessive sadness, anger, or anxiety during pregnancy or during the first 12 months after she gives birth, she has a condition called perinatal depression. Depression can interfere with work, school, relationships, and other everyday activities. If it is not managed properly, it can also cause problems in the mother and her baby. Sometimes, perinatal depression is left untreated because symptoms are thought to be normal mood swings during and right after pregnancy. If you have symptoms of depression, it is important to talk with your health care provider. What are the causes? The exact cause of this condition is not known. Hormonal changes during and after pregnancy may play a role in causing perinatal depression. What increases the risk? You are more likely to develop this condition if:  You have a personal or family history of depression, anxiety, or mood disorders.  You experience a stressful life event during pregnancy, such as the death of a loved one.  You have a lot of regular life stress.  You do not have support from family members or loved ones, or you are in an abusive relationship. What are the signs or symptoms? Symptoms of this condition include:  Feeling sad or hopeless.  Feelings of guilt.  Feeling irritable or overwhelmed.  Changes in your appetite.  Lack of energy or motivation.  Sleep problems.  Difficulty concentrating or completing tasks.  Loss of interest in hobbies or relationships.  Headaches or stomach problems that do not go away. How is this diagnosed? This condition is diagnosed based on a physical exam and mental evaluation. In some cases, your health care provider may use a depression screening tool. These tools include a list of questions that can help a health care provider diagnose depression. Your health care provider may refer you to a mental health expert who specializes in depression. How is this  treated? This condition may be treated with:  Medicines. Your health care provider will only give you medicines that have been proven safe for pregnancy and breastfeeding.  Talk therapy with a mental health professional to help change your patterns of thinking (cognitive behavioral therapy).  Support groups.  Brain stimulation or light therapies.  Stress reduction therapies, such as mindfulness. Follow these instructions at home: Lifestyle  Do not use any products that contain nicotine or tobacco, such as cigarettes and e-cigarettes. If you need help quitting, ask your health care provider.  Do not use alcohol when you are pregnant. After your baby is born, limit alcohol intake to no more than 1 drink a day. One drink equals 12 oz of beer, 5 oz of wine, or 1 oz of hard liquor.  Consider joining a support group for new mothers. Ask your health care provider for recommendations.  Take good care of yourself. Make sure you: ? Get plenty of sleep. If you are having trouble sleeping, talk with your health care provider. ? Eat a healthy diet. This includes plenty of fruits and vegetables, whole grains, and lean proteins. ? Exercise regularly, as told by your health care provider. Ask your health care provider what exercises are safe for you. General instructions  Take over-the-counter and prescription medicines only as told by your health care provider.  Talk with your partner or family members about your feelings during pregnancy. Share any concerns or anxieties that you may have.  Ask for help with tasks or chores when you need it. Ask friends and family members to provide meals, watch your children, or help with   cleaning.  Keep all follow-up visits as told by your health care provider. This is important. Contact a health care provider if:  You (or people close to you) notice that you have any symptoms of depression.  You have depression and your symptoms get worse.  You  experience side effects from medicines, such as nausea or sleep problems. Get help right away if:  You feel like hurting yourself, your baby, or someone else. If you ever feel like you may hurt yourself or others, or have thoughts about taking your own life, get help right away. You can go to your nearest emergency department or call:  Your local emergency services (911 in the [place]  A suicide crisis helpline, such as the [facility] at 903-738-1520. This is open 24 hours a day. Summary  Perinatal depression is when a woman feels excessive sadness, anger, or anxiety during pregnancy or during the first 12 months after she gives birth.  If perinatal depression is not treated, it can lead to health problems for the mother and her baby.  This condition is treated with medicines, talk therapy, stress reduction therapies, or a combination of two or more treatments.  Talk with your partner or family members about your feelings. Do not be afraid to ask for help. This information is not intended to replace advice given to you by your health care provider. Make sure you discuss any questions you have with your health care provider. Document Revised: 08/27/2018 Document Reviewed: 05/09/2016 Elsevier Patient Education  [place].  Sertraline tablets What is this medicine? SERTRALINE (SER tra leen) is used to treat depression. It may also be used to treat obsessive compulsive disorder, panic disorder, post-trauma stress, premenstrual dysphoric disorder (PMDD) or social anxiety. This medicine may be used for other purposes; ask your health care provider or pharmacist if you have questions. COMMON BRAND NAME(S): Zoloft What should I tell my health care provider before I take this medicine? They need to know if you have any of these conditions:  bleeding disorders  bipolar disorder or a family history of bipolar disorder  glaucoma  heart disease  high blood  pressure  history of irregular heartbeat  history of low levels of calcium, magnesium, or potassium in the blood  if you often drink alcohol  liver disease  receiving electroconvulsive therapy  seizures  suicidal thoughts, plans, or attempt; a previous suicide attempt by you or a family member  take medicines that treat or prevent blood clots  thyroid disease  an unusual or allergic reaction to sertraline, other medicines, foods, dyes, or preservatives  pregnant or trying to get pregnant  breast-feeding How should I use this medicine? Take this medicine by mouth with a glass of water. Follow the directions on the prescription label. You can take it with or without food. Take your medicine at regular intervals. Do not take your medicine more often than directed. Do not stop taking this medicine suddenly except upon the advice of your doctor. Stopping this medicine too quickly may cause serious side effects or your condition may worsen. A special MedGuide will be given to you by the pharmacist with each prescription and refill. Be sure to read this information carefully each time. Talk to your pediatrician regarding the use of this medicine in children. While this drug may be prescribed for children as young as 7 years for selected conditions, precautions do apply. Overdosage: If you think you have taken too much of this medicine contact  a poison control center or emergency room at once. NOTE: This medicine is only for you. Do not share this medicine with others. What if I miss a dose? If you miss a dose, take it as soon as you can. If it is almost time for your next dose, take only that dose. Do not take double or extra doses. What may interact with this medicine? Do not take this medicine with any of the following medications:  cisapride  dronedarone  linezolid  MAOIs like Carbex, Eldepryl, Marplan, Nardil, and Parnate  methylene blue (injected into a  vein)  pimozide  thioridazine This medicine may also interact with the following medications:  alcohol  amphetamines  aspirin and aspirin-like medicines  certain medicines for depression, anxiety, or psychotic disturbances  certain medicines for fungal infections like ketoconazole, fluconazole, posaconazole, and itraconazole  certain medicines for irregular heart beat like flecainide, quinidine, propafenone  certain medicines for migraine headaches like almotriptan, eletriptan, frovatriptan, naratriptan, rizatriptan, sumatriptan, zolmitriptan  certain medicines for sleep  certain medicines for seizures like carbamazepine, valproic acid, phenytoin  certain medicines that treat or prevent blood clots like warfarin, enoxaparin, dalteparin  cimetidine  digoxin  diuretics  fentanyl  isoniazid  lithium  NSAIDs, medicines for pain and inflammation, like ibuprofen or naproxen  other medicines that prolong the QT interval (cause an abnormal heart rhythm) like dofetilide  rasagiline  safinamide  supplements like St. John's wort, kava kava, valerian  tolbutamide  tramadol  tryptophan This list may not describe all possible interactions. Give your health care provider a list of all the medicines, herbs, non-prescription drugs, or dietary supplements you use. Also tell them if you smoke, drink alcohol, or use illegal drugs. Some items may interact with your medicine. What should I watch for while using this medicine? Tell your doctor if your symptoms do not get better or if they get worse. Visit your doctor or health care professional for regular checks on your progress. Because it may take several weeks to see the full effects of this medicine, it is important to continue your treatment as prescribed by your doctor. Patients and their families should watch out for new or worsening thoughts of suicide or depression. Also watch out for sudden changes in feelings such as  feeling anxious, agitated, panicky, irritable, hostile, aggressive, impulsive, severely restless, overly excited and hyperactive, or not being able to sleep. If this happens, especially at the beginning of treatment or after a change in dose, call your health care professional. Bonita Quin may get drowsy or dizzy. Do not drive, use machinery, or do anything that needs mental alertness until you know how this medicine affects you. Do not stand or sit up quickly, especially if you are an older patient. This reduces the risk of dizzy or fainting spells. Alcohol may interfere with the effect of this medicine. Avoid alcoholic drinks. Your mouth may get dry. Chewing sugarless gum or sucking hard candy, and drinking plenty of water may help. Contact your doctor if the problem does not go away or is severe. What side effects may I notice from receiving this medicine? Side effects that you should report to your doctor or health care professional as soon as possible:  allergic reactions like skin rash, itching or hives, swelling of the face, lips, or tongue  anxious  black, tarry stools  changes in vision  confusion  elevated mood, decreased need for sleep, racing thoughts, impulsive behavior  eye pain  fast, irregular heartbeat  feeling faint or  lightheaded, falls  feeling agitated, angry, or irritable  hallucination, loss of contact with reality  loss of balance or coordination  loss of memory  painful or prolonged erections  restlessness, pacing, inability to keep still  seizures  stiff muscles  suicidal thoughts or other mood changes  trouble sleeping  unusual bleeding or bruising  unusually weak or tired  vomiting Side effects that usually do not require medical attention (report to your doctor or health care professional if they continue or are bothersome):  change in appetite or weight  change in sex drive or performance  diarrhea  increased sweating  indigestion,  nausea  tremors This list may not describe all possible side effects. Call your doctor for medical advice about side effects. You may report side effects to FDA at 1-800-FDA-1088. Where should I keep my medicine? Keep out of the reach of children. Store at room temperature between 15 and 30 degrees C (59 and 86 degrees F). Throw away any unused medicine after the expiration date. NOTE: This sheet is a summary. It may not cover all possible information. If you have questions about this medicine, talk to your doctor, pharmacist, or health care provider.  2020 Elsevier/Gold Standard (2018-03-04 10:09:27)

## 2019-07-02 NOTE — Progress Notes (Signed)
INITIAL OBSTETRICAL VISIT Patient name: Andrea Romero MRN [id number]  Date of birth: Aug 12, 1990 Chief Complaint:   Initial Prenatal Visit  History of Present Illness:   Andrea Romero is a 29 y.o. [id number] Caucasian female at [redacted]w[redacted]d by LMP with an Estimated Date of Delivery: 01/04/20 being seen today for her initial obstetrical visit.  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 This is a planned pregnancy. She and the father of the baby (FOB) "Christian" are involved. She has a support system that consists of the FOB, her family and friends. Today she reports feels like she might need a mood stabilizer. She was on Lexapro as a teen and thinks something like that would help  Patient's last menstrual period was 03/30/2019. Last pap 09/17/2016. Results were: normal Review of Systems:   Pertinent items are noted in HPI Denies cramping/contractions, leakage of fluid, vaginal bleeding, abnormal vaginal discharge w/ itching/odor/irritation, headaches, visual changes, shortness of breath, chest pain, abdominal pain, severe nausea/vomiting, or problems with urination or bowel movements unless otherwise stated above.  Pertinent History Reviewed:  Reviewed past medical,surgical, social, obstetrical and family history.  Reviewed problem list, medications and allergies. OB History  Gravida Para Term Preterm AB Living  5 2   2 2 3   SAB TAB Ectopic Multiple Live Births  2     1 3     # Outcome Date GA Lbr Len/2nd Weight Sex Delivery Anes PTL Lv  5 Current           4 Preterm 08/15/17 [redacted]w[redacted]d  7 lb 7 oz (3.374 kg) F Vag-Spont EPI Y LIV  3A Preterm 08/11/16 [redacted]w[redacted]d 17:00 / 00:29 2 lb 9.6 oz (1.18 kg) F Vag-Spont None  LIV  3B Preterm 08/11/16 [redacted]w[redacted]d 17:00 / 00:46 4 lb 0.6 oz (1.83 kg) F Vag-Spont None  LIV  2 SAB 2017 [redacted]w[redacted]d         1 SAB 2016 [redacted]w[redacted]d          Physical Assessment:   Vitals:    07/02/19 1435  BP: 99/61  Pulse: 77  Temp: 98 F (36.7 C)  Weight: 114 lb (51.7 kg)  Body mass index is 23.03 kg/m.       Physical Examination:  General appearance - well appearing, and in no distress  Mental status - alert, oriented to person, place, and time  Psych:  She has a normal mood and affect  Skin - warm and dry, normal color, no suspicious lesions noted  Chest - effort normal, all lung fields clear to auscultation bilaterally  Heart - normal rate and regular rhythm  Abdomen - soft, nontender  Extremities:  No swelling or varicosities noted  Pelvic - VULVA: normal appearing vulva with no masses, tenderness or lesions  VAGINA: normal appearing vagina with normal color and discharge, no lesions.   CERVIX: normal appearing cervix without discharge or lesions, no CMT  Thin prep pap is done without HR HPV cotesting    Assessment & Plan:  1) High-Risk Pregnancy [id number] at [redacted]w[redacted]d with an Estimated Date of Delivery: 01/04/20   2) Initial OB visit - Welcomed to practice and introduced self to patient in addition to discussing other advanced practice providers that she may be seeing at this practice - Congratulated patient - Anticipatory guidance on upcoming appointments - Educated on [place] and pregnancy and the integration of virtual appointments  - Educated on  babyscripts app- patient reports she has not received email, encouraged to look in spam folder and to call office if she still has not received email - patient verbalizes understanding   3) Supervision of other normal pregnancy, antepartum - Plan: Obstetric Panel, Including HIV, Culture, OB Urine, Hepatitis C Antibody, Hemoglobin A1c, Glucose, Genetic Screening, Cytology - PAP( [facility]), Cervicovaginal ancillary only( [place])  4) History of gestational diabetes in prior pregnancy, currently pregnant  - Hemoglobin A1c, Glucose  5) History of preterm delivery, currently pregnant - Will start 17-P injections at 16  wks  6) Pap smear for cervical cancer screening  - Cytology - PAP( [facility])  7) Depression affecting pregnancy  - Rx for sertraline (ZOLOFT) 25 MG tablet per recommendation of Gwyndolyn Saxon, LCSW - F/U with Gwyndolyn Saxon, LCSW in 2 weeks    Meds:  Meds ordered this encounter  Medications  . sertraline (ZOLOFT) 25 MG tablet    Sig: Take 1 tablet (25 mg total) by mouth daily.    Dispense:  30 tablet    Refill:  3    Order Specific Question:   Supervising Provider    Answer:   Reva Bores [[id number]]    Initial labs obtained Continue prenatal vitamins Reviewed n/v relief measures and warning s/s to report Reviewed recommended weight gain based on pre-gravid BMI Encouraged well-balanced diet Genetic Screening discussed: ordered Cystic fibrosis, SMA, Fragile X screening discussed ordered The nature of [facility] with multiple MDs and other Advanced Practice Providers was explained to patient; also emphasized that residents, students are part of our team.  Discussed optimized OB schedule and video visits. Advised can have an in-office visit whenever she feels she needs to be seen.  Does not have own BP cuff. BP cuff Rx faxed today. Explained to patient that BP will be mailed to her house. Check BP weekly, let us know if >140/90. Advised to call during normal business hours and there is an after-hours nurse line available.    Follow-up: Return in about 2 weeks (around 07/16/2019) for Mood check with Gwyndolyn Saxon, LCSW- new start on anti-depressant  ROB on MC in 4 wks.   Orders Placed This Encounter  Procedures  . Culture, OB Urine  . Urine Culture, OB Reflex  . [place] MFM OB DETAIL +14 WK  . Obstetric Panel, Including HIV  . Hepatitis C Antibody  . Hemoglobin A1c  . Glucose  . Genetic Screening    [name] MSN, [place] 07/02/2019

## 2019-07-03 LAB — OBSTETRIC PANEL, INCLUDING HIV
Antibody Screen: NEGATIVE
Basophils Absolute: 0 10*3/uL (ref 0.0–0.2)
Basos: 0 %
EOS (ABSOLUTE): 0 10*3/uL (ref 0.0–0.4)
Eos: 1 %
HIV Screen 4th Generation wRfx: NONREACTIVE
Hematocrit: 36.1 % (ref 34.0–46.6)
Hemoglobin: 12.3 g/dL (ref 11.1–15.9)
Hepatitis B Surface Ag: NEGATIVE
Immature Grans (Abs): 0 10*3/uL (ref 0.0–0.1)
Immature Granulocytes: 0 %
Lymphocytes Absolute: 1.9 10*3/uL (ref 0.7–3.1)
Lymphs: 27 %
MCH: 31.2 pg (ref 26.6–33.0)
MCHC: 34.1 g/dL (ref 31.5–35.7)
MCV: 92 fL (ref 79–97)
Monocytes Absolute: 0.4 10*3/uL (ref 0.1–0.9)
Monocytes: 6 %
Neutrophils Absolute: 4.6 10*3/uL (ref 1.4–7.0)
Neutrophils: 66 %
Platelets: 251 10*3/uL (ref 150–450)
RBC: 3.94 x10E6/uL (ref 3.77–5.28)
RDW: 11.8 % (ref 11.7–15.4)
RPR Ser Ql: NONREACTIVE
Rh Factor: POSITIVE
Rubella Antibodies, IGG: 1.61 index (ref >0.99)
WBC: 7 10*3/uL (ref 3.4–10.8)

## 2019-07-03 LAB — HEPATITIS C ANTIBODY: Hep C Virus Ab: 1.1 s/co ratio — ABNORMAL HIGH (ref 0.0–0.9)

## 2019-07-03 LAB — GLUCOSE, RANDOM: Glucose: 81 mg/dL (ref 65–99)

## 2019-07-03 LAB — HEMOGLOBIN A1C
Est. average glucose Bld gHb Est-mCnc: 103 mg/dL
Hgb A1c MFr Bld: 5.2 % (ref 4.8–5.6)

## 2019-07-04 LAB — URINE CULTURE, OB REFLEX

## 2019-07-04 LAB — CULTURE, OB URINE

## 2019-07-06 ENCOUNTER — Telehealth: Payer: Self-pay | Admitting: Obstetrics and Gynecology

## 2019-07-06 ENCOUNTER — Encounter: Payer: Self-pay | Admitting: General Practice

## 2019-07-06 ENCOUNTER — Other Ambulatory Visit: Payer: Self-pay | Admitting: Obstetrics and Gynecology

## 2019-07-06 ENCOUNTER — Other Ambulatory Visit (INDEPENDENT_AMBULATORY_CARE_PROVIDER_SITE_OTHER): Payer: Medicaid Other | Admitting: Obstetrics and Gynecology

## 2019-07-06 ENCOUNTER — Telehealth: Payer: Self-pay | Admitting: General Practice

## 2019-07-06 DIAGNOSIS — R768 Other specified abnormal immunological findings in serum: Secondary | ICD-10-CM

## 2019-07-06 LAB — CERVICOVAGINAL ANCILLARY ONLY
Bacterial Vaginitis (gardnerella): NEGATIVE
Candida Glabrata: NEGATIVE
Candida Vaginitis: NEGATIVE
Chlamydia: NEGATIVE
Comment: NEGATIVE
Comment: NEGATIVE
Comment: NEGATIVE
Comment: NEGATIVE
Comment: NEGATIVE
Comment: NORMAL
Neisseria Gonorrhea: NEGATIVE
Trichomonas: NEGATIVE

## 2019-07-06 NOTE — Telephone Encounter (Signed)
After verifying DOB, patient requested to be called on her sister's cell phone, because of " poor cell service and being out of [id number] [phone]. Patient ID verified by DOB again.  Reviewed Hep C lab results with patient. Advised that consulted with Dr. Debroah Loop and Dr. Crissie Reese on interpretation of these results would mean for her pregnancy. Notified that patient of some ways that she could have been exposed to Hep C. Patient admits to having a h/o IV drug use "years ago, but now sober for a long time."  Patient questioned if this could have been transmitted by sex, because she did have sex with someone other than her boyfriend when they were broken up. Advised that there is a small chance of sexual transmission, but given her h/o IV drug use the chances of exposure from IV drug [place] is much greater and more likely. Informed her that she would need more blood work to determine the RNA component of the Hep C and her liver function test. Patient questioned how this would affect pregnancy. Advised that we would send her to GI for management and treatment advice and we would still classify her as HROB d/t her poor pregnancy history. Reassurance given that CNMs would co-manage her care with MDs. Patient verbalized an understanding of the plan of care and agrees.   GI referral, lab orders placed. Message to Andris Flurry to get find out if [facility] has enough blood to run the additional tests or if patient would need to come in for another blood draw.   [name], CNM  07/06/2019 11:41 AM

## 2019-07-06 NOTE — Progress Notes (Signed)
Ordered for (+) Hep C blood results

## 2019-07-06 NOTE — Telephone Encounter (Signed)
Pt called and wanted to discuss lab results.  Informed patient RN is not available today and we do not have a provider here. Explained to pt that usually patients will receive lab results via Mchart before the provider will have a chance to review them. Message sent to nurse to call patient back the first thing in the A.M.  Patient was ok with that.  A few minutes later, mother of patient called and stated that patient called her crying and upset that no one at the office could speak to her about lab results.  Explained to mother of patient that I could not discuss any information with her due to patient did not list her on DPR. She verbalized understanding.  Called patient back to apologize that I didn't know that she was upset or I would have called a nurse from one of our other offices to discuss.  Patient also apologized as well.  Informed patient that she will receive a call from our [place] location.  Pt verbalized understanding.

## 2019-07-07 ENCOUNTER — Other Ambulatory Visit: Payer: Self-pay | Admitting: Obstetrics and Gynecology

## 2019-07-07 DIAGNOSIS — R768 Other specified abnormal immunological findings in serum: Secondary | ICD-10-CM

## 2019-07-07 LAB — CYTOLOGY - PAP: Diagnosis: NEGATIVE

## 2019-07-08 ENCOUNTER — Telehealth: Payer: Self-pay | Admitting: General Practice

## 2019-07-08 NOTE — Telephone Encounter (Signed)
Patient called to check to see if lab results came back that was ordered on Monday, 07/06/2019 & if we knew how long it would take.  Told patient that I would consult with Lab Tech and would give her a call back.  Andrea Romero with [facility] called to check on specimen, however, the specimen is still in process and results will be back in 6-7 business days.  Called patient to inform her of this information but she did not pick up.  Left a message and asked patient to give our office a call back.

## 2019-07-09 ENCOUNTER — Telehealth: Payer: Self-pay | Admitting: General Practice

## 2019-07-09 ENCOUNTER — Encounter: Payer: Self-pay | Admitting: General Practice

## 2019-07-09 NOTE — Telephone Encounter (Signed)
Patient is scheduled with [facility] on 07/29/2019 at 1:30pm.

## 2019-07-10 ENCOUNTER — Encounter: Payer: Self-pay | Admitting: General Practice

## 2019-07-10 LAB — COMPREHENSIVE METABOLIC PANEL

## 2019-07-10 LAB — SPECIMEN STATUS REPORT

## 2019-07-10 LAB — HEPATITIS C VRS RNA DETECT BY PCR-QUAL: HCV RNA NAA Qualitative: NEGATIVE

## 2019-07-14 ENCOUNTER — Other Ambulatory Visit: Payer: Self-pay

## 2019-07-14 ENCOUNTER — Other Ambulatory Visit (INDEPENDENT_AMBULATORY_CARE_PROVIDER_SITE_OTHER): Payer: Medicaid Other | Admitting: *Deleted

## 2019-07-14 DIAGNOSIS — R768 Other specified abnormal immunological findings in serum: Secondary | ICD-10-CM | POA: Diagnosis not present

## 2019-07-14 NOTE — Progress Notes (Signed)
Patient in clinic for CMET and Hep C RNA quant. Patient stated she need to sign ROI to have records transferred to [facility] for prenatal care.  Clovis Pu, RN

## 2019-07-15 ENCOUNTER — Encounter: Payer: Self-pay | Admitting: General Practice

## 2019-07-16 ENCOUNTER — Encounter: Payer: Medicaid Other | Admitting: Licensed Clinical Social Worker

## 2019-07-16 LAB — COMPREHENSIVE METABOLIC PANEL
ALT: 8 IU/L (ref 0–32)
AST: 15 IU/L (ref 0–40)
Albumin/Globulin Ratio: 1.7 (ref 1.2–2.2)
Albumin: 4 g/dL (ref 3.9–5.0)
Alkaline Phosphatase: 44 IU/L (ref 39–117)
BUN/Creatinine Ratio: 17 (ref 9–23)
BUN: 10 mg/dL (ref 6–20)
Bilirubin Total: 0.2 mg/dL (ref 0.0–1.2)
CO2: 20 mmol/L (ref 20–29)
Calcium: 9.3 mg/dL (ref 8.7–10.2)
Chloride: 103 mmol/L (ref 96–106)
Creatinine, Ser: 0.6 mg/dL (ref 0.57–1.00)
GFR calc Af Amer: 144 mL/min/{1.73_m2} (ref >59)
GFR calc non Af Amer: 125 mL/min/{1.73_m2} (ref >59)
Globulin, Total: 2.4 g/dL (ref 1.5–4.5)
Glucose: 90 mg/dL (ref 65–99)
Potassium: 4.2 mmol/L (ref 3.5–5.2)
Sodium: 135 mmol/L (ref 134–144)
Total Protein: 6.4 g/dL (ref 6.0–8.5)

## 2019-07-16 LAB — HCV RNA QUANT: Hepatitis C Quantitation: NOT DETECTED IU/mL

## 2019-07-20 DIAGNOSIS — F419 Anxiety disorder, unspecified: Secondary | ICD-10-CM | POA: Insufficient documentation

## 2019-07-21 ENCOUNTER — Telehealth: Payer: Self-pay | Admitting: General Practice

## 2019-07-21 NOTE — Telephone Encounter (Signed)
Transferred care to [facility].

## 2019-07-28 ENCOUNTER — Encounter: Payer: Self-pay | Admitting: General Practice

## 2019-07-28 DIAGNOSIS — O3680X0 Pregnancy with inconclusive fetal viability, not applicable or unspecified: Secondary | ICD-10-CM | POA: Diagnosis not present

## 2019-07-28 DIAGNOSIS — N912 Amenorrhea, unspecified: Secondary | ICD-10-CM | POA: Diagnosis not present

## 2019-07-28 DIAGNOSIS — Z3492 Encounter for supervision of normal pregnancy, unspecified, second trimester: Secondary | ICD-10-CM | POA: Diagnosis not present

## 2019-07-28 DIAGNOSIS — O09892 Supervision of other high risk pregnancies, second trimester: Secondary | ICD-10-CM | POA: Diagnosis not present

## 2019-07-28 DIAGNOSIS — F329 Major depressive disorder, single episode, unspecified: Secondary | ICD-10-CM | POA: Diagnosis not present

## 2019-07-30 ENCOUNTER — Encounter: Payer: Medicaid Other | Admitting: Obstetrics and Gynecology

## 2019-08-10 ENCOUNTER — Other Ambulatory Visit (HOSPITAL_COMMUNITY): Payer: Medicaid Other

## 2019-08-10 ENCOUNTER — Ambulatory Visit (HOSPITAL_COMMUNITY): Payer: Medicaid Other

## 2019-08-11 DIAGNOSIS — Z3A19 19 weeks gestation of pregnancy: Secondary | ICD-10-CM | POA: Diagnosis not present

## 2019-08-11 DIAGNOSIS — O09212 Supervision of pregnancy with history of pre-term labor, second trimester: Secondary | ICD-10-CM | POA: Diagnosis not present

## 2019-08-11 DIAGNOSIS — Z87898 Personal history of other specified conditions: Secondary | ICD-10-CM | POA: Diagnosis not present

## 2019-08-11 DIAGNOSIS — Z3480 Encounter for supervision of other normal pregnancy, unspecified trimester: Secondary | ICD-10-CM | POA: Diagnosis not present

## 2019-08-11 DIAGNOSIS — Z3689 Encounter for other specified antenatal screening: Secondary | ICD-10-CM | POA: Diagnosis not present

## 2019-09-08 DIAGNOSIS — Z87898 Personal history of other specified conditions: Secondary | ICD-10-CM | POA: Diagnosis not present

## 2019-09-08 DIAGNOSIS — F329 Major depressive disorder, single episode, unspecified: Secondary | ICD-10-CM | POA: Diagnosis not present

## 2019-09-08 DIAGNOSIS — Z3480 Encounter for supervision of other normal pregnancy, unspecified trimester: Secondary | ICD-10-CM | POA: Diagnosis not present

## 2019-09-08 DIAGNOSIS — F419 Anxiety disorder, unspecified: Secondary | ICD-10-CM | POA: Diagnosis not present

## 2019-09-08 DIAGNOSIS — Z8751 Personal history of pre-term labor: Secondary | ICD-10-CM | POA: Diagnosis not present

## 2019-10-05 DIAGNOSIS — Z23 Encounter for immunization: Secondary | ICD-10-CM | POA: Diagnosis not present

## 2019-10-05 DIAGNOSIS — Z3A27 27 weeks gestation of pregnancy: Secondary | ICD-10-CM | POA: Diagnosis not present

## 2019-10-05 DIAGNOSIS — Z3480 Encounter for supervision of other normal pregnancy, unspecified trimester: Secondary | ICD-10-CM | POA: Diagnosis not present

## 2019-10-05 DIAGNOSIS — O09212 Supervision of pregnancy with history of pre-term labor, second trimester: Secondary | ICD-10-CM | POA: Diagnosis not present

## 2019-10-05 DIAGNOSIS — O26892 Other specified pregnancy related conditions, second trimester: Secondary | ICD-10-CM | POA: Diagnosis not present

## 2019-10-05 DIAGNOSIS — N898 Other specified noninflammatory disorders of vagina: Secondary | ICD-10-CM | POA: Diagnosis not present

## 2019-10-13 DIAGNOSIS — R7309 Other abnormal glucose: Secondary | ICD-10-CM | POA: Diagnosis not present

## 2019-11-03 DIAGNOSIS — O368130 Decreased fetal movements, third trimester, not applicable or unspecified: Secondary | ICD-10-CM | POA: Diagnosis not present

## 2019-11-12 DIAGNOSIS — Z87898 Personal history of other specified conditions: Secondary | ICD-10-CM | POA: Diagnosis not present

## 2019-11-12 DIAGNOSIS — O368130 Decreased fetal movements, third trimester, not applicable or unspecified: Secondary | ICD-10-CM | POA: Diagnosis not present

## 2019-11-17 DIAGNOSIS — O368130 Decreased fetal movements, third trimester, not applicable or unspecified: Secondary | ICD-10-CM | POA: Diagnosis not present

## 2019-12-04 ENCOUNTER — Encounter (HOSPITAL_COMMUNITY): Payer: Self-pay | Admitting: Obstetrics & Gynecology

## 2019-12-04 ENCOUNTER — Inpatient Hospital Stay (HOSPITAL_COMMUNITY)
Admission: AD | Admit: 2019-12-04 | Discharge: 2019-12-04 | Disposition: A | Discharge status: Home / Self Care | Payer: Medicaid Other | Attending: Obstetrics & Gynecology | Admitting: Obstetrics & Gynecology

## 2019-12-04 ENCOUNTER — Other Ambulatory Visit: Payer: Self-pay

## 2019-12-04 ENCOUNTER — Inpatient Hospital Stay (HOSPITAL_BASED_OUTPATIENT_CLINIC_OR_DEPARTMENT_OTHER): Payer: Medicaid Other

## 2019-12-04 DIAGNOSIS — O4703 False labor before 37 completed weeks of gestation, third trimester: Secondary | ICD-10-CM | POA: Insufficient documentation

## 2019-12-04 DIAGNOSIS — Z0371 Encounter for suspected problem with amniotic cavity and membrane ruled out: Secondary | ICD-10-CM

## 2019-12-04 DIAGNOSIS — O26893 Other specified pregnancy related conditions, third trimester: Secondary | ICD-10-CM

## 2019-12-04 DIAGNOSIS — Z3A35 35 weeks gestation of pregnancy: Secondary | ICD-10-CM | POA: Diagnosis not present

## 2019-12-04 DIAGNOSIS — O09213 Supervision of pregnancy with history of pre-term labor, third trimester: Secondary | ICD-10-CM

## 2019-12-04 DIAGNOSIS — R109 Unspecified abdominal pain: Secondary | ICD-10-CM | POA: Diagnosis not present

## 2019-12-04 DIAGNOSIS — Z79899 Other long term (current) drug therapy: Secondary | ICD-10-CM | POA: Diagnosis not present

## 2019-12-04 LAB — URINALYSIS, ROUTINE W REFLEX MICROSCOPIC
Bilirubin Urine: NEGATIVE
Glucose, UA: NEGATIVE mg/dL
Hgb urine dipstick: NEGATIVE
Ketones, ur: NEGATIVE mg/dL
Nitrite: NEGATIVE
Protein, ur: NEGATIVE mg/dL
Specific Gravity, Urine: 1.006 (ref 1.005–1.030)
pH: 7 (ref 5.0–8.0)

## 2019-12-04 LAB — AMNISURE RUPTURE OF MEMBRANE (ROM) NOT AT ARMC: Amnisure ROM: NEGATIVE

## 2019-12-04 LAB — POCT FERN TEST: POCT Fern Test: NEGATIVE

## 2019-12-04 NOTE — Discharge Instructions (Signed)
Reasons to return to MAU at [facility]:  Since you are preterm, return to MAU if:  1.  Contractions are 10 minutes apart or less and they becoming more uncomfortable or painful over time 2.  You have a large gush of fluid, or a trickle of fluid that will not stop and you have to wear a pad 3.  You have bleeding that is bright red, heavier than spotting--like menstrual bleeding (spotting can be normal in early labor or after a check of your cervix) 4.  You do not feel the baby moving like he/she normally does  

## 2019-12-04 NOTE — MAU Provider Note (Addendum)
Student note:   History     CSN: [id number]  Arrival date and time: 12/04/19 1345   First Provider Initiated Contact with Patient 12/04/19 1501      Chief Complaint  Patient presents with   Rupture of Membranes   HPI  Andrea Romero is a 29 y.o. [id number] at [redacted]w[redacted]d who present to MAU after her water broke on Wednesday night. Fluid leaked for several hours and then stopped. Shortly after water broke she began having contractions and has had intermittent irregular contractions since then. She says water broke while on the toilet and that she squatted after and coughed and had enough fluid to fill a paper towel. She then continued to have leakage with multiple pools of fluid on the floor. Closest together was 8 min, but have been hours apart and not predictable. Longest contraction was 1.5 min. Not all contractions were noted by patient. She reports a headache since Wednesday night that is relieved by tylenol. Denies recent illness, cough, congestion, fever, chills, sick contacts, visual changes. Reports this presentation is exactly the same as her last 2 times of premature labor. The only difference was last night while lying in a recliner she felt like "jello". She was dizzy, nauseous, felt her hear was racing, hot and chest felt tight. Denies current chest pain, shortness of breath, calf pain, redness in legs. She never has experienced anything like this before.   OB History     Gravida  5   Para  2   Term      Preterm  2   AB  2   Living  3      SAB  2   TAB      Ectopic      Multiple  1   Live Births  3           Past Medical History:  Diagnosis Date   Medical history non-contributory     Past Surgical History:  Procedure Laterality Date   NO PAST SURGERIES      No family history on file.  Social History   Tobacco Use   Smoking status: Never Smoker   Smokeless tobacco: Never Used  Vaping Use   Vaping Use: Never used  Substance Use Topics   Alcohol  use: No   Drug use: No    Allergies: No Known Allergies  Medications Prior to Admission  Medication Sig Dispense Refill Last Dose   Prenatal Vit-Fe Fumarate-FA (PRENATAL VITAMIN PO) Take 1 tablet by mouth at bedtime.    12/04/2019 at Unknown time   Misc. Devices (GOJJI WEIGHT SCALE) MISC 1 Device by Does not apply route daily as needed. To weight self daily as needed at home. ICD-10 code: O09.90 1 each 0    sertraline (ZOLOFT) 25 MG tablet Take 1 tablet (25 mg total) by mouth daily. 30 tablet 3     Review of Systems  All other systems reviewed and are negative.  Physical Exam   Blood pressure 126/68, pulse 88, temperature 98.3 F (36.8 C), temperature source Oral, resp. rate 16, height 4\' 9"  (1.448 m), weight 67 kg, last menstrual period 03/30/2019, SpO2 99 %, unknown if currently breastfeeding.  Physical Exam Cardiovascular:     Rate and Rhythm: Normal rate and regular rhythm.     Pulses: Normal pulses.     Heart sounds: Normal heart sounds.  Pulmonary:     Effort: Pulmonary effort is normal.     Breath sounds: Normal breath sounds.  Abdominal:     General: Bowel sounds are normal.     Tenderness: There is no abdominal tenderness.  Genitourinary:    Cervix: Dilated.     Comments: 2cm dilated and 50% effaced; no pooling of fulid Skin:    Capillary Refill: Capillary refill takes less than 2 seconds.  Neurological:     General: No focal deficit present.     Mental Status: She is alert and oriented to person, place, and time.     MAU Course  Procedures:  Speculum exam  Fern exam: negative  No pooling of fluid with valsalva.  Ultrasound: AFI 18.45 Amniosure: negative  MDM Results for orders placed or performed during the hospital encounter of 12/04/19 (from the past 24 hour(s))  Urinalysis, Routine w reflex microscopic Urine, Clean Catch     Status: Abnormal   Collection Time: 12/04/19  3:00 PM  Result Value Ref Range   Color, Urine STRAW (A) YELLOW   APPearance  CLEAR CLEAR   Specific Gravity, Urine 1.006 1.005 - 1.030   pH 7.0 5.0 - 8.0   Glucose, UA NEGATIVE NEGATIVE mg/dL   Hgb urine dipstick NEGATIVE NEGATIVE   Bilirubin Urine NEGATIVE NEGATIVE   Ketones, ur NEGATIVE NEGATIVE mg/dL   Protein, ur NEGATIVE NEGATIVE mg/dL   Nitrite NEGATIVE NEGATIVE   Leukocytes,Ua TRACE (A) NEGATIVE   RBC / HPF 0-5 0 - 5 RBC/hpf   WBC, UA 0-5 0 - 5 WBC/hpf   Bacteria, UA RARE (A) NONE SEEN   Squamous Epithelial / LPF 0-5 0 - 5  Amnisure rupture of membrane (rom)not at [facility]     Status: None   Collection Time: 12/04/19  4:30 PM  Result Value Ref Range   Amnisure ROM NEGATIVE     Assessment and Plan  Rupture of membranes not found:  Given negative amniosure, fern test, pooling exam, ultrasound AFI measurement and no change in dilation, effacement, or contractions patient was discharged home in stable condition with return precautions to return if more fluid leaks or contractions increase and follow up with outpatient OB next week.   Norton Blizzard 12/04/2019, 6:25 PM   I confirm that I have verified the information documented in the student 's note and that I have also personally performed the physical exam and all medical decision making activities. See my complete note.  Sharen Counter, CNM 8:48 PM

## 2019-12-04 NOTE — MAU Provider Note (Signed)
Chief Complaint:  Rupture of Membranes   First Provider Initiated Contact with Patient 12/04/19 1556      HPI: Andrea Romero is a 29 y.o. [id number] at [redacted]w[redacted]d by LMP who presents to maternity admissions reporting leaking fluid starting 2 days ago with onset of cramping every 10-15 minutes.  She reports clear fluid that started leaking after urinating on Wednesday, then continued x 2-3 hours. It stopped after that with no further leaking.  She has hx preterm labor x 2 and reports this feels similar to how labor started with those.   She reports good fetal movement.    Location: lower abdomen Quality: cramping Severity: 7/10 on pain scale Duration: 2 days Timing: intermittent, every 15 minutes Modifying factors: none Associated signs and symptoms: leaking fluid  HPI  Past Medical History: Past Medical History:  Diagnosis Date  . Medical history non-contributory     Past obstetric history: OB History  Gravida Para Term Preterm AB Living  5 2   2 2 3   SAB TAB Ectopic Multiple Live Births  2     1 3     # Outcome Date GA Lbr Len/2nd Weight Sex Delivery Anes PTL Lv  5 Current           4 Preterm 08/15/17 [redacted]w[redacted]d  3374 g F Vag-Spont EPI Y LIV  3A Preterm 08/11/16 [redacted]w[redacted]d 17:00 / 00:29 1180 g F Vag-Spont None  LIV  3B Preterm 08/11/16 [redacted]w[redacted]d 17:00 / 00:46 1830 g F Vag-Spont None  LIV  2 SAB 2017 [redacted]w[redacted]d         1 SAB 2016 [redacted]w[redacted]d           Past Surgical History: Past Surgical History:  Procedure Laterality Date  . NO PAST SURGERIES      Family History: No family history on file.  Social History: Social History   Tobacco Use  . Smoking status: Never Smoker  . Smokeless tobacco: Never Used  Vaping Use  . Vaping Use: Never used  Substance Use Topics  . Alcohol use: No  . Drug use: No    Allergies: No Known Allergies  Meds:  Medications Prior to Admission  Medication Sig Dispense Refill Last Dose  . Prenatal Vit-Fe Fumarate-FA (PRENATAL VITAMIN PO) Take 1 tablet by mouth at  bedtime.    12/04/2019 at Unknown time  . Misc. Devices (GOJJI WEIGHT SCALE) MISC 1 Device by Does not apply route daily as needed. To weight self daily as needed at home. ICD-10 code: O09.90 1 each 0   . sertraline (ZOLOFT) 25 MG tablet Take 1 tablet (25 mg total) by mouth daily. 30 tablet 3     ROS:  Review of Systems  Constitutional: Negative for chills, fatigue and fever.  Eyes: Negative for visual disturbance.  Respiratory: Negative for shortness of breath.   Cardiovascular: Negative for chest pain.  Gastrointestinal: Positive for abdominal pain. Negative for nausea and vomiting.  Genitourinary: Positive for pelvic pain and vaginal discharge. Negative for difficulty urinating, dysuria, flank pain, vaginal bleeding and vaginal pain.  Neurological: Negative for dizziness and headaches.  Psychiatric/Behavioral: Negative.      I have reviewed patient's Past Medical Hx, Surgical Hx, Family Hx, Social Hx, medications and allergies.   Physical Exam   Patient Vitals for the past 24 hrs:  BP Temp Temp src Pulse Resp SpO2 Height Weight  12/04/19 1828 112/62 -- -- 79 18 -- -- --  12/04/19 1821 112/62 -- -- 79 -- -- -- --  12/04/19  1405 126/68 98.3 F (36.8 C) Oral 88 16 99 % 4\' 9"  (1.448 m) 67 kg   Constitutional: Well-developed, well-nourished female in no acute distress.  Cardiovascular: normal rate Respiratory: normal effort GI: Abd soft, non-tender, gravid appropriate for gestational age.  MS: Extremities nontender, no edema, normal ROM Neurologic: Alert and oriented x 4.  GU: Neg CVAT.  PELVIC EXAM: Cervix pink, visually closed, without lesion, scant white creamy discharge, No pooling of fluid with valsalva, vaginal walls and external genitalia normal.  Dilation: 2 Effacement (%): 50 Presentation: Vertex Exam by:: L.Leftwich,CNM  FHT:  Baseline 145 , moderate variability, accelerations present, no decelerations Contractions: irritability noted, irregular, mild to  palpation   Labs: Results for orders placed or performed during the hospital encounter of 12/04/19 (from the past 24 hour(s))  Urinalysis, Routine w reflex microscopic Urine, Clean Catch     Status: Abnormal   Collection Time: 12/04/19  3:00 PM  Result Value Ref Range   Color, Urine STRAW (A) YELLOW   APPearance CLEAR CLEAR   Specific Gravity, Urine 1.006 1.005 - 1.030   pH 7.0 5.0 - 8.0   Glucose, UA NEGATIVE NEGATIVE mg/dL   Hgb urine dipstick NEGATIVE NEGATIVE   Bilirubin Urine NEGATIVE NEGATIVE   Ketones, ur NEGATIVE NEGATIVE mg/dL   Protein, ur NEGATIVE NEGATIVE mg/dL   Nitrite NEGATIVE NEGATIVE   Leukocytes,Ua TRACE (A) NEGATIVE   RBC / HPF 0-5 0 - 5 RBC/hpf   WBC, UA 0-5 0 - 5 WBC/hpf   Bacteria, UA RARE (A) NONE SEEN   Squamous Epithelial / LPF 0-5 0 - 5  Fern Test     Status: Normal   Collection Time: 12/04/19  4:05 PM  Result Value Ref Range   POCT Fern Test Negative = intact amniotic membranes   Amnisure rupture of membrane (rom)not at [facility]     Status: None   Collection Time: 12/04/19  4:30 PM  Result Value Ref Range   Amnisure ROM NEGATIVE    O/Positive/-- (04/08 1523)  Imaging:  No results found.  MAU Course/MDM: Orders Placed This Encounter  Procedures  . 09-10-2002 MFM OB Limited  . Urinalysis, Routine w reflex microscopic Urine, Clean Catch  . Amnisure rupture of membrane (rom)not at [facility]  . Fern Test  . Discharge patient    No orders of the defined types were placed in this encounter.    NST reviewed and reactive Negative pooling and negative ferning on slide Amnisure negative Given pt hx preterm delivery and pictures of watery fluid, [facility] ordered for AFI AFI 18 on Limited OB US No evidence of PPROM today Rechecked cervix and unchanged in 2-3 hours in MAU D/C home with PTL precautions F/U in office as scheduled Return to MAU as needed for signs of labor or emergencies   Assessment: 1. Encounter for suspected premature rupture of membranes, with  rupture of membranes not found   2. Abdominal pain during pregnancy in third trimester     Plan: Discharge home Labor precautions and fetal kick counts  Follow-up Information    [facility] Follow up.   Specialty: Obstetrics and Gynecology Contact information: [address] [place] [phone]       [facility] Follow up.   Specialty: Obstetrics and Gynecology Why: Return to MAU as needed for emergencies Contact information: [address] [place] [phone]  Allergies as of 12/04/2019   No Known Allergies     Medication List    TAKE these medications   Gojji Weight Scale Misc 1 Device by Does not apply route daily as needed. To weight self daily as needed at home. ICD-10 code: O09.90   PRENATAL VITAMIN PO Take 1 tablet by mouth at bedtime.   sertraline 25 MG tablet Commonly known as: Zoloft Take 1 tablet (25 mg total) by mouth daily.       Sharen Counter Certified Nurse-Midwife 12/04/2019 6:44 PM

## 2019-12-04 NOTE — MAU Note (Signed)
Water broke Wed night. leaked for several hours and then stopped.  Was waiting for labor to start and it never did.  No bleeding.

## 2019-12-08 DIAGNOSIS — Z3483 Encounter for supervision of other normal pregnancy, third trimester: Secondary | ICD-10-CM | POA: Diagnosis not present

## 2019-12-15 ENCOUNTER — Inpatient Hospital Stay (HOSPITAL_COMMUNITY): Payer: Medicaid Other | Admitting: Anesthesiology

## 2019-12-15 ENCOUNTER — Encounter (HOSPITAL_COMMUNITY): Payer: Self-pay | Admitting: Obstetrics & Gynecology

## 2019-12-15 ENCOUNTER — Inpatient Hospital Stay (HOSPITAL_COMMUNITY)
Admission: AD | Admit: 2019-12-15 | Discharge: 2019-12-17 | DRG: 807 | Disposition: A | Discharge status: Home / Self Care | Payer: Medicaid Other | Attending: Family Medicine | Admitting: Family Medicine

## 2019-12-15 ENCOUNTER — Other Ambulatory Visit: Payer: Self-pay

## 2019-12-15 DIAGNOSIS — O26893 Other specified pregnancy related conditions, third trimester: Secondary | ICD-10-CM | POA: Diagnosis not present

## 2019-12-15 DIAGNOSIS — Z30017 Encounter for initial prescription of implantable subdermal contraceptive: Secondary | ICD-10-CM | POA: Diagnosis not present

## 2019-12-15 DIAGNOSIS — F1911 Other psychoactive substance abuse, in remission: Secondary | ICD-10-CM

## 2019-12-15 DIAGNOSIS — E669 Obesity, unspecified: Secondary | ICD-10-CM | POA: Diagnosis present

## 2019-12-15 DIAGNOSIS — Z20822 Contact with and (suspected) exposure to covid-19: Secondary | ICD-10-CM | POA: Diagnosis present

## 2019-12-15 DIAGNOSIS — O09299 Supervision of pregnancy with other poor reproductive or obstetric history, unspecified trimester: Secondary | ICD-10-CM

## 2019-12-15 DIAGNOSIS — Z3A37 37 weeks gestation of pregnancy: Secondary | ICD-10-CM | POA: Diagnosis not present

## 2019-12-15 DIAGNOSIS — Z30430 Encounter for insertion of intrauterine contraceptive device: Secondary | ICD-10-CM

## 2019-12-15 DIAGNOSIS — Z3480 Encounter for supervision of other normal pregnancy, unspecified trimester: Secondary | ICD-10-CM

## 2019-12-15 DIAGNOSIS — Z8632 Personal history of gestational diabetes: Secondary | ICD-10-CM

## 2019-12-15 DIAGNOSIS — O99214 Obesity complicating childbirth: Secondary | ICD-10-CM | POA: Diagnosis not present

## 2019-12-15 DIAGNOSIS — O4693 Antepartum hemorrhage, unspecified, third trimester: Secondary | ICD-10-CM

## 2019-12-15 DIAGNOSIS — O4593 Premature separation of placenta, unspecified, third trimester: Principal | ICD-10-CM | POA: Diagnosis present

## 2019-12-15 DIAGNOSIS — O4690 Antepartum hemorrhage, unspecified, unspecified trimester: Secondary | ICD-10-CM | POA: Diagnosis present

## 2019-12-15 DIAGNOSIS — O09899 Supervision of other high risk pregnancies, unspecified trimester: Secondary | ICD-10-CM

## 2019-12-15 DIAGNOSIS — Z87898 Personal history of other specified conditions: Secondary | ICD-10-CM

## 2019-12-15 HISTORY — DX: Other psychoactive substance abuse, in remission: F19.11

## 2019-12-15 LAB — TYPE AND SCREEN
ABO/RH(D): O POS
Antibody Screen: NEGATIVE

## 2019-12-15 LAB — RAPID URINE DRUG SCREEN, HOSP PERFORMED
Amphetamines: NOT DETECTED
Barbiturates: NOT DETECTED
Benzodiazepines: NOT DETECTED
Cocaine: NOT DETECTED
Opiates: NOT DETECTED
Tetrahydrocannabinol: NOT DETECTED

## 2019-12-15 LAB — CBC
HCT: 33 % — ABNORMAL LOW (ref 36.0–46.0)
Hemoglobin: 10.7 g/dL — ABNORMAL LOW (ref 12.0–15.0)
MCH: 28.4 pg (ref 26.0–34.0)
MCHC: 32.4 g/dL (ref 30.0–36.0)
MCV: 87.5 fL (ref 80.0–100.0)
Platelets: 232 10*3/uL (ref 150–400)
RBC: 3.77 MIL/uL — ABNORMAL LOW (ref 3.87–5.11)
RDW: 12.3 % (ref 11.5–15.5)
WBC: 9.6 10*3/uL (ref 4.0–10.5)
nRBC: 0 % (ref 0.0–0.2)

## 2019-12-15 LAB — GROUP B STREP BY PCR: Group B strep by PCR: NEGATIVE

## 2019-12-15 LAB — RPR: RPR Ser Ql: NONREACTIVE

## 2019-12-15 LAB — SARS CORONAVIRUS 2 BY RT PCR (HOSPITAL ORDER, PERFORMED IN ~~LOC~~ HOSPITAL LAB): SARS Coronavirus 2: NEGATIVE

## 2019-12-15 MED ORDER — ACETAMINOPHEN 325 MG PO TABS: 650.0000 mg | ORAL_TABLET | ORAL | Status: DC | PRN

## 2019-12-15 MED ORDER — FENTANYL-BUPIVACAINE-NACL 0.5-0.125-0.9 MG/250ML-% EP SOLN
EPIDURAL | Status: AC
  Filled 2019-12-15: qty 250

## 2019-12-15 MED ORDER — LACTATED RINGERS IV SOLN: INTRAVENOUS | Status: DC

## 2019-12-15 MED ORDER — TETANUS-DIPHTH-ACELL PERTUSSIS 5-2.5-18.5 LF-MCG/0.5 IM SUSP: 0.5000 mL | Freq: Once | INTRAMUSCULAR | Status: DC

## 2019-12-15 MED ORDER — ONDANSETRON HCL 4 MG/2ML IJ SOLN: 4.0000 mg | INTRAMUSCULAR | Status: DC | PRN

## 2019-12-15 MED ORDER — LACTATED RINGERS IV SOLN
500.0000 mL | Freq: Once | INTRAVENOUS | Status: AC
  Administered 2019-12-15: 500 mL via INTRAVENOUS

## 2019-12-15 MED ORDER — SENNOSIDES-DOCUSATE SODIUM 8.6-50 MG PO TABS
2.0000 | ORAL_TABLET | ORAL | Status: DC
  Administered 2019-12-16 (×2): 2 via ORAL
  Filled 2019-12-15 (×2): qty 2

## 2019-12-15 MED ORDER — ONDANSETRON HCL 4 MG PO TABS: 4.0000 mg | ORAL_TABLET | ORAL | Status: DC | PRN

## 2019-12-15 MED ORDER — FENTANYL-BUPIVACAINE-NACL 0.5-0.125-0.9 MG/250ML-% EP SOLN: 12.0000 mL/h | EPIDURAL | Status: DC | PRN

## 2019-12-15 MED ORDER — SIMETHICONE 80 MG PO CHEW: 80.0000 mg | CHEWABLE_TABLET | ORAL | Status: DC | PRN

## 2019-12-15 MED ORDER — DIPHENHYDRAMINE HCL 50 MG/ML IJ SOLN: 12.5000 mg | INTRAMUSCULAR | Status: DC | PRN

## 2019-12-15 MED ORDER — LIDOCAINE HCL (PF) 1 % IJ SOLN
INTRAMUSCULAR | Status: DC | PRN
  Administered 2019-12-15: 2 mL via EPIDURAL
  Administered 2019-12-15: 10 mL via EPIDURAL

## 2019-12-15 MED ORDER — FENTANYL CITRATE (PF) 2500 MCG/50ML IJ SOLN
INTRAMUSCULAR | Status: DC | PRN
Start: 2019-12-15 — End: 2019-12-15
  Administered 2019-12-15: 12 mL/h via EPIDURAL

## 2019-12-15 MED ORDER — DIPHENHYDRAMINE HCL 25 MG PO CAPS: 25.0000 mg | ORAL_CAPSULE | Freq: Four times a day (QID) | ORAL | Status: DC | PRN

## 2019-12-15 MED ORDER — WITCH HAZEL-GLYCERIN EX PADS: 1.0000 "application " | MEDICATED_PAD | CUTANEOUS | Status: DC | PRN

## 2019-12-15 MED ORDER — EPHEDRINE 5 MG/ML INJ: 10.0000 mg | INTRAVENOUS | Status: DC | PRN

## 2019-12-15 MED ORDER — ONDANSETRON HCL 4 MG/2ML IJ SOLN: 4.0000 mg | Freq: Four times a day (QID) | INTRAMUSCULAR | Status: DC | PRN

## 2019-12-15 MED ORDER — OXYTOCIN BOLUS FROM INFUSION: 333.0000 mL | Freq: Once | INTRAVENOUS | Status: DC

## 2019-12-15 MED ORDER — ZOLPIDEM TARTRATE 5 MG PO TABS: 5.0000 mg | ORAL_TABLET | Freq: Every evening | ORAL | Status: DC | PRN

## 2019-12-15 MED ORDER — LIDOCAINE HCL (PF) 1 % IJ SOLN: 30.0000 mL | INTRAMUSCULAR | Status: DC | PRN

## 2019-12-15 MED ORDER — IBUPROFEN 800 MG PO TABS
800.0000 mg | ORAL_TABLET | Freq: Three times a day (TID) | ORAL | Status: DC
  Administered 2019-12-15 – 2019-12-17 (×5): 800 mg via ORAL
  Filled 2019-12-15 (×6): qty 1

## 2019-12-15 MED ORDER — SERTRALINE HCL 25 MG PO TABS
25.0000 mg | ORAL_TABLET | Freq: Every day | ORAL | Status: DC
  Filled 2019-12-15 (×2): qty 1

## 2019-12-15 MED ORDER — SOD CITRATE-CITRIC ACID 500-334 MG/5ML PO SOLN: 30.0000 mL | ORAL | Status: DC | PRN

## 2019-12-15 MED ORDER — PRENATAL MULTIVITAMIN CH
1.0000 | ORAL_TABLET | Freq: Every day | ORAL | Status: DC
  Administered 2019-12-16 – 2019-12-17 (×2): 1 via ORAL
  Filled 2019-12-15 (×2): qty 1

## 2019-12-15 MED ORDER — OXYTOCIN-SODIUM CHLORIDE 30-0.9 UT/500ML-% IV SOLN
2.5000 [IU]/h | INTRAVENOUS | Status: DC
  Filled 2019-12-15: qty 500

## 2019-12-15 MED ORDER — PHENYLEPHRINE 40 MCG/ML (10ML) SYRINGE FOR IV PUSH (FOR BLOOD PRESSURE SUPPORT): 80.0000 ug | PREFILLED_SYRINGE | INTRAVENOUS | Status: DC | PRN

## 2019-12-15 MED ORDER — ACETAMINOPHEN 325 MG PO TABS
650.0000 mg | ORAL_TABLET | Freq: Four times a day (QID) | ORAL | Status: DC
  Administered 2019-12-16 – 2019-12-17 (×6): 650 mg via ORAL
  Filled 2019-12-15 (×7): qty 2

## 2019-12-15 MED ORDER — COCONUT OIL OIL: 1.0000 "application " | TOPICAL_OIL | Status: DC | PRN

## 2019-12-15 MED ORDER — FAMOTIDINE 20 MG PO TABS
40.0000 mg | ORAL_TABLET | Freq: Once | ORAL | Status: AC
  Administered 2019-12-16: 40 mg via ORAL
  Filled 2019-12-15: qty 2

## 2019-12-15 MED ORDER — METOCLOPRAMIDE HCL 10 MG PO TABS
10.0000 mg | ORAL_TABLET | Freq: Once | ORAL | Status: AC
  Administered 2019-12-16: 10 mg via ORAL
  Filled 2019-12-15: qty 1

## 2019-12-15 MED ORDER — DIBUCAINE (PERIANAL) 1 % EX OINT: 1.0000 "application " | TOPICAL_OINTMENT | CUTANEOUS | Status: DC | PRN

## 2019-12-15 MED ORDER — OXYCODONE-ACETAMINOPHEN 5-325 MG PO TABS: 2.0000 | ORAL_TABLET | ORAL | Status: DC | PRN

## 2019-12-15 MED ORDER — LACTATED RINGERS IV SOLN
500.0000 mL | INTRAVENOUS | Status: DC | PRN
  Administered 2019-12-15: 1000 mL via INTRAVENOUS

## 2019-12-15 MED ORDER — OXYCODONE-ACETAMINOPHEN 5-325 MG PO TABS: 1.0000 | ORAL_TABLET | ORAL | Status: DC | PRN

## 2019-12-15 MED ORDER — BENZOCAINE-MENTHOL 20-0.5 % EX AERO
1.0000 "application " | INHALATION_SPRAY | CUTANEOUS | Status: DC | PRN
  Administered 2019-12-15: 1 via TOPICAL
  Filled 2019-12-15: qty 56

## 2019-12-15 NOTE — MAU Provider Note (Signed)
History   [id number]   Chief Complaint  Patient presents with   Vaginal Bleeding    HPI Andrea Romero is a 29 y.o. female  [id number] @37 .1 wks here with report of ctx and VB.  Reports onset 1.5 hrs ago. Noticed large amt VB. Thinks her water broke on the way to hospital. She reports + fetal movement. All other systems negative. She is getting care at [facility] in [place]. 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   Patient's last menstrual period was 03/30/2019.  OB History  Gravida Para Term Preterm AB Living  5 2   2 2 3   SAB TAB Ectopic Multiple Live Births  2     1 3     # Outcome Date GA Lbr Len/2nd Weight Sex Delivery Anes PTL Lv  5 Current           4 Preterm 08/15/17 [redacted]w[redacted]d  3374 g F Vag-Spont EPI Y LIV  3A Preterm 08/11/16 [redacted]w[redacted]d 17:00 / 00:29 1180 g F Vag-Spont None  LIV  3B Preterm 08/11/16 [redacted]w[redacted]d 17:00 / 00:46 1830 g F Vag-Spont None  LIV  2 SAB 2017 [redacted]w[redacted]d         1 SAB 2016 [redacted]w[redacted]d           Past Medical History:  Diagnosis Date   Medical history non-contributory     History reviewed. No pertinent family history.  Social History   Socioeconomic History   Marital status: Single    Spouse name: Not on file   Number of children: 2   Years of education: Not on file   Highest education level: High school graduate  Occupational History   Occupation: Unemployed  Tobacco Use   Smoking status: Never Smoker   Smokeless tobacco: Never Used  [redacted]w[redacted]d Use: Never used  Substance and Sexual Activity   Alcohol use: No   Drug use: No   Sexual activity: Yes    Birth control/protection: None    Comment: patient wants birthcontrol pill  Other Topics Concern   Not on file  Social History Narrative   Not on file   Social Determinants of Health   Financial Resource Strain: Low Risk    Difficulty of Paying Living Expenses: Not very hard  Food Insecurity: No Food Insecurity   Worried About 2017 in the Last  Year: Never true   Ran Out of Food in the Last Year: Never true  Transportation Needs: No Transportation Needs   Lack of Transportation (Medical): No   Lack of Transportation (Non-Medical): No  Physical Activity: Inactive   Days of Exercise per Week: 0 days   Minutes of Exercise per Session: 0 min  Stress: No Stress Concern Present   Feeling of Stress : Only a little  Social Connections:    Frequency of Communication with Friends and Family: Not on file   Frequency of Social Gatherings with Friends and Family: Not on file   Attends Religious Services: Not on file   Active Member of Clubs or Organizations: Not on file   Attends [redacted]w[redacted]d Meetings: Not on file   Marital Status: Not on file    No Known Allergies  No current facility-administered medications on file prior to encounter.   Current Outpatient Medications on File Prior to Encounter  Medication Sig Dispense Refill   Prenatal Vit-Fe Fumarate-FA (PRENATAL VITAMIN PO) Take 1 tablet by mouth at  bedtime.      Misc. Devices (GOJJI WEIGHT SCALE) MISC 1 Device by Does not apply route daily as needed. To weight self daily as needed at home. ICD-10 code: O09.90 1 each 0   sertraline (ZOLOFT) 25 MG tablet Take 1 tablet (25 mg total) by mouth daily. 30 tablet 3     Review of Systems   Physical Exam   There were no vitals filed for this visit.  Physical Exam Vitals and nursing note reviewed. Exam conducted with a chaperone present.  Constitutional:      General: She is in acute distress.     Appearance: Normal appearance.  HENT:     Head: Normocephalic and atraumatic.  Genitourinary:    Comments: SSE: large amt of blood and clots SVE: 3/90/ballot vtx per RN Musculoskeletal:     Cervical back: Normal range of motion.  Neurological:     Mental Status: She is alert.   EFM: 135 bpm, mod variability, + accels, no decels Toco: q1-2  MAU Course  Procedures  MDM Suspect placental abruption and early labor.  Plan for admit. Cresenzo-Dishmon, CNM notified.   Assessment and Plan  [redacted] weeks gestation NST reactive Early labor Vaginal bleeding Admit to LD Mngt per labor team   [name], [place] 12/15/2019 4:35 AM

## 2019-12-15 NOTE — Discharge Summary (Signed)
Postpartum Discharge Summary     Patient Name: Andrea Romero DOB: [birth year] MRN: [id number]  Date of admission: 12/15/2019 Delivery date:12/15/2019  Delivering provider: Randa Ngo  Date of discharge: 12/17/2019  Admitting diagnosis: Vaginal bleeding in pregnancy [O46.90] Intrauterine pregnancy: [redacted]w[redacted]d    Secondary diagnosis:  Principal Problem:   Vaginal delivery Active Problems:   Supervision of other normal pregnancy, antepartum   History of gestational diabetes in prior pregnancy, currently pregnant   History of preterm delivery, currently pregnant   Vaginal bleeding in pregnancy   [place]  Additional problems: as noted above   Discharge diagnosis: Term Vag Delivery                                         Post partum procedures:Nexplanon placement on PPD#2 Augmentation: AROM Complications: Placental Abruption (minimal)  Hospital course: Onset of Labor With Vaginal Delivery      29y.o. yo [id number] 323w1das admitted in Latent Labor on 12/15/2019. Patient had an uncomplicated labor course as follows:  Membrane Rupture Time/Date: 10:45 AM ,12/15/2019   Delivery Method:Vaginal, Spontaneous  Episiotomy: None  Lacerations:  None  Patient had an uncomplicated postpartum course.  She is ambulating, tolerating a regular diet, passing flatus, and urinating well. Patient is discharged home in stable condition on 12/17/19. She is requesting placement of a Nexplanon prior to d/c home.  Newborn Data: Birth date:12/15/2019  Birth time:2:55 PM  Gender:Female  Living status:Living  Apgars:9 ,9  Weight:3260 g   Magnesium Sulfate received: No BMZ received: No Rhophylac:N/A MMR:N/A T-DaP:not given prenatally Flu: N/A Transfusion:No  Physical exam  Vitals:   12/16/19 0430 12/16/19 0800 12/16/19 1338 12/16/19 2107  BP: 115/69 110/74 125/72 (!) 107/56  Pulse: 86 73 66 75  Resp: '18 16 18 16  ' Temp: 98.3 F (36.8 C) 98.1 F (36.7 C) 98 F (36.7  C) 98.3 F (36.8 C)  TempSrc: Oral Oral  Oral  SpO2: 99% 100% 99% 100%  Weight:      Height:       General: alert and cooperative Lochia: appropriate Uterine Fundus: firm Incision: N/A DVT Evaluation: No evidence of DVT seen on physical exam. Labs: Lab Results  Component Value Date   WBC 9.6 12/15/2019   HGB 10.7 (L) 12/15/2019   HCT 33.0 (L) 12/15/2019   MCV 87.5 12/15/2019   PLT 232 12/15/2019   CMP Latest Ref Rng & Units 07/14/2019  Glucose 65 - 99 mg/dL 90  BUN 6 - 20 mg/dL 10  Creatinine 0.57 - 1.00 mg/dL 0.60  Sodium 134 - 144 mmol/L 135  Potassium 3.5 - 5.2 mmol/L 4.2  Chloride 96 - 106 mmol/L 103  CO2 20 - 29 mmol/L 20  Calcium 8.7 - 10.2 mg/dL 9.3  Total Protein 6.0 - 8.5 g/dL 6.4  Total Bilirubin 0.0 - 1.2 mg/dL 0.2  Alkaline Phos 39 - 117 IU/L 44  AST 0 - 40 IU/L 15  ALT 0 - 32 IU/L 8   Edinburgh Score: Edinburgh Postnatal Depression Scale Screening Tool 12/16/2019  I have been able to laugh and see the funny side of things. 0  I have looked forward with enjoyment to things. 0  I have blamed myself unnecessarily when things went wrong. 0  I have been anxious or worried for no good reason. 0  I have felt scared or  panicky for no good reason. 0  Things have been getting on top of me. 0  I have been so unhappy that I have had difficulty sleeping. 0  I have felt sad or miserable. 0  I have been so unhappy that I have been crying. 0  The thought of harming myself has occurred to me. 0  Edinburgh Postnatal Depression Scale Total 0     After visit meds:  Allergies as of 12/17/2019   No Known Allergies     Medication List    STOP taking these medications   Gojji Weight Scale Misc     TAKE these medications   ibuprofen 800 MG tablet Commonly known as: ADVIL Take 1 tablet (800 mg total) by mouth every 8 (eight) hours as needed for cramping.   PRENATAL VITAMIN PO Take 1 tablet by mouth at bedtime.   sertraline 25 MG tablet Commonly known as:  Zoloft Take 1 tablet (25 mg total) by mouth daily.        Discharge home in stable condition Infant Feeding: Breast Infant Disposition:home with mother Discharge instruction: per After Visit Summary and Postpartum booklet. Activity: Advance as tolerated. Pelvic rest for 6 weeks.  Diet: routine diet Future Appointments:No future appointments. Follow up Visit:  Follow-up Information    [place]. Schedule an appointment as soon as possible for a visit in 4 week(s).   Specialty: Obstetrics and Gynecology Why: for your postpartum appointment Contact information: [place] [phone]               Please schedule this patient for a In person postpartum visit in 4 weeks with the following provider: Any provider. Additional Postpartum F/U:none  High risk pregnancy complicated by: h/o substance abuse, h/o gDM, h/o preterm delivery Delivery mode:  Vaginal, Spontaneous  Anticipated Birth Control:  PP Nexplanon placed   12/17/2019 Myrtis Ser, CNM  8:11 AM

## 2019-12-15 NOTE — MAU Note (Signed)
...  Andrea Romero is a 29 y.o. at [redacted]w[redacted]d here in MAU reporting: around 0300 she woke up to the feeling of her underwear being wet as well as experiencing CTX's. Pt reports she lost what she believes was her mucous plug shortly after. Pt reports while getting in the car to come to the hospital her water broke. +FM. Bright red vaginal bleeding.

## 2019-12-15 NOTE — Lactation Note (Signed)
This note was copied from a baby's chart. Lactation Consultation Note Attempted to see mom 2 times, once mom in bathroom, the 2nd time mom was sleeping.  Patient Name: Girl Hiliana Eilts [id number]'L Date: 12/15/2019     Maternal Data    Feeding Feeding Type: Bottle Fed - Formula Nipple Type: Slow - flow  LATCH Score                   Interventions    Lactation Tools Discussed/Used     Consult Status      Xaviar Lunn G 12/15/2019, 11:22 PM

## 2019-12-15 NOTE — Anesthesia Preprocedure Evaluation (Signed)
Anesthesia Evaluation  Patient identified by MRN, date of birth, ID band Patient awake    Reviewed: Allergy & Precautions, Patient's Chart, lab work & pertinent test results  Airway Mallampati: II  TM Distance: >3 FB Neck ROM: Full    Dental no notable dental hx.    Pulmonary neg pulmonary ROS,    Pulmonary exam normal breath sounds clear to auscultation       Cardiovascular negative cardio ROS Normal cardiovascular exam Rhythm:Regular Rate:Normal     Neuro/Psych PSYCHIATRIC DISORDERS Depression negative neurological ROS     GI/Hepatic negative GI ROS, Neg liver ROS,   Endo/Other  Obesity BMI 32  Renal/GU negative Renal ROS  negative genitourinary   Musculoskeletal negative musculoskeletal ROS (+)   Abdominal (+) + obese,   Peds negative pediatric ROS (+)  Hematology  (+) Blood dyscrasia, anemia , hct 33, plt 232   Anesthesia Other Findings   Reproductive/Obstetrics (+) Pregnancy G5, partial abruption, dilated to 5-6cm in MAU                            Anesthesia Physical Anesthesia Plan  ASA: III and emergent  Anesthesia Plan: Epidural   Post-op Pain Management:    Induction:   PONV Risk Score and Plan: 2  Airway Management Planned: Natural Airway  Additional Equipment: None  Intra-op Plan:   Post-operative Plan:   Informed Consent: I have reviewed the patients History and Physical, chart, labs and discussed the procedure including the risks, benefits and alternatives for the proposed anesthesia with the patient or authorized representative who has indicated his/her understanding and acceptance.       Plan Discussed with:   Anesthesia Plan Comments:         Anesthesia Quick Evaluation

## 2019-12-15 NOTE — Anesthesia Procedure Notes (Signed)
Epidural Patient location during procedure: OB Start time: 12/15/2019 6:02 AM End time: 12/15/2019 6:10 AM  Staffing Anesthesiologist: Lannie Fields, DO Performed: anesthesiologist   Preanesthetic Checklist Completed: patient identified, IV checked, risks and benefits discussed, monitors and equipment checked, pre-op evaluation and timeout performed  Epidural Patient position: sitting Prep: DuraPrep and site prepped and draped Patient monitoring: continuous pulse ox, blood pressure, heart rate and cardiac monitor Approach: midline Location: L3-L4 Injection technique: LOR air  Needle:  Needle type: Tuohy  Needle gauge: 17 G Needle length: 9 cm Needle insertion depth: 5 cm Catheter type: closed end flexible Catheter size: 19 Gauge Catheter at skin depth: 10 cm Test dose: negative  Assessment Sensory level: T8 Events: blood not aspirated, injection not painful, no injection resistance, no paresthesia and negative IV test  Additional Notes Patient identified. Risks/Benefits/Options discussed with patient including but not limited to bleeding, infection, nerve damage, paralysis, failed block, incomplete pain control, headache, blood pressure changes, nausea, vomiting, reactions to medication both or allergic, itching and postpartum back pain. Confirmed with bedside nurse the patient's most recent platelet count. Confirmed with patient that they are not currently taking any anticoagulation, have any bleeding history or any family history of bleeding disorders. Patient expressed understanding and wished to proceed. All questions were answered. Sterile technique was used throughout the entire procedure. Please see nursing notes for vital signs. Test dose was given through epidural catheter and negative prior to continuing to dose epidural or start infusion. Warning signs of high block given to the patient including shortness of breath, tingling/numbness in hands, complete motor  block, or any concerning symptoms with instructions to call for help. Patient was given instructions on fall risk and not to get out of bed. All questions and concerns addressed with instructions to call with any issues or inadequate analgesia.  Reason for block:procedure for pain

## 2019-12-15 NOTE — Progress Notes (Addendum)
LABOR PROGRESS NOTE  Andrea Romero is a 29 y.o. [id number] at [redacted]w[redacted]d admitted for early labor.   Subjective: Patient with epidural in place, now reporting more pressure, but no sensation of contractions. Minimal bleeding noted per nursing.   Objective: BP 109/75 (BP Location: Left Arm)   Pulse 92   Temp 98 F (36.7 C) (Oral)   Resp 17   Ht 4\' 9"  (1.448 m)   Wt 68 kg   LMP 03/30/2019   SpO2 99%   BMI 32.46 kg/m   Dilation: 8 Effacement (%): 90 Cervical Position: Middle Station: 0 Presentation: Vertex Exam by:: [email or web address] Fetal monitoring: Baseline: 130 bpm, Variability: moderate, Accelerations: Reactive, and Decelerations: Absent Uterine activity: Frequency: Every 2-3 minutes, adequate  Labs: Lab Results  Component Value Date   WBC 9.6 12/15/2019   HGB 10.7 (L) 12/15/2019   HCT 33.0 (L) 12/15/2019   MCV 87.5 12/15/2019   PLT 232 12/15/2019    Patient Active Problem List   Diagnosis Date Noted   Vaginal bleeding in pregnancy 12/15/2019   Supervision of other normal pregnancy, antepartum 06/15/2019   History of gestational diabetes in prior pregnancy, currently pregnant 06/15/2019   History of preterm delivery, currently pregnant 06/15/2019   History of low birth weight 06/15/2019    Assessment / Plan: Spontaneous labor, progressing normally  #Labor: Progressing normally s/p AROM for clear fluid at 1045. IUPC now in place. Will plan to recheck cervical exam in 2 hours or sooner as clinically indicated. #Fetal Wellbeing:  Category I strip #Pain Control: Epidural #ID: GBS neg #Vaginal Bleeding  C/f Abruption: minimal bleeding currently and reassuringly appropriate FHTs. #Anticipated MOD: NSVD #H/o IVDU: plan for SW consult in postpartum period #Contraception: pt desires BTL but unclear if papers signed previously in prenatal period. Will f/u outside records.  06/17/2019 MD, PGY-1 Family Medicine Resident, [facility] Faculty Teaching Service  12/15/2019, 10:59  AM  Attestation of Supervision of Student:  I confirm that I have verified the information documented in the  resident's  note and that I have also personally reperformed the history, physical exam and all medical decision making activities.  I have verified that all services and findings are accurately documented in this student's note; and I agree with management and plan as outlined in the documentation. I have also made any necessary editorial changes.  12/17/2019, MD [facility], [facility] 12/15/2019 11:12 AM

## 2019-12-15 NOTE — H&P (Signed)
Andrea Romero is a 29 y.o. female [phone] with IUP at [redacted]w[redacted]d by [place] presenting for contractions/bleeding/?water broke. See MAU note: .  Genitourinary:    Comments: SSE: large amt of blood and clots SVE: 3/90/ballot vtx per RN Musculoskeletal:     Cervical back: Normal range of motion.  Neurological:     Mental Status: She is alert.   EFM: 135 bpm, mod variability, + accels, no decels Toco: q1-2  MAU Course  Procedures  MDM Suspect placental abruption and early labor. Plan for admit. Cresenzo-Dishmon, CNM notified   She reports positive fetal movement. g.  Prenatal History/Complications: PNC at [facility]  Pregnancy complications:  -  PTD X2 (twins @ 5 and SVD @ 31)  Past Medical History: Past Medical History:  Diagnosis Date  . Medical history non-contributory   Hx IV drug use ("lon g time ago")/hep C (cleared)  Past Surgical History: Past Surgical History:  Procedure Laterality Date  . NO PAST SURGERIES      Obstetrical History: OB History    Gravida  5   Para  2   Term      Preterm  2   AB  2   Living  3     SAB  2   TAB      Ectopic      Multiple  1   Live Births  3            Social History: Social History   Socioeconomic History  . Marital status: Single    Spouse name: Not on file  . Number of children: 2  . Years of education: Not on file  . Highest education level: High school graduate  Occupational History  . Occupation: Unemployed  Tobacco Use  . Smoking status: Never Smoker  . Smokeless tobacco: Never Used  Vaping Use  . Vaping Use: Never used  Substance and Sexual Activity  . Alcohol use: No  . Drug use: No  . Sexual activity: Yes    Birth control/protection: None    Comment: patient wants birthcontrol pill  Other Topics Concern  . Not on file  Social History Narrative  . Not on file   Social Determinants of Health   Financial Resource Strain: Low Risk   . Difficulty of Paying Living Expenses: Not  very hard  Food Insecurity: No Food Insecurity  . Worried About Programme researcher, broadcasting/film/video in the Last Year: Never true  . Ran Out of Food in the Last Year: Never true  Transportation Needs: No Transportation Needs  . Lack of Transportation (Medical): No  . Lack of Transportation (Non-Medical): No  Physical Activity: Inactive  . Days of Exercise per Week: 0 days  . Minutes of Exercise per Session: 0 min  Stress: No Stress Concern Present  . Feeling of Stress : Only a little  Social Connections:   . Frequency of Communication with Friends and Family: Not on file  . Frequency of Social Gatherings with Friends and Family: Not on file  . Attends Religious Services: Not on file  . Active Member of Clubs or Organizations: Not on file  . Attends Banker Meetings: Not on file  . Marital Status: Not on file    Family History: History reviewed. No pertinent family history.  Allergies: No Known Allergies  Medications Prior to Admission  Medication Sig Dispense Refill Last Dose  . Prenatal Vit-Fe Fumarate-FA (PRENATAL VITAMIN PO) Take 1 tablet by mouth at bedtime.    12/14/2019  at Unknown time  . Misc. Devices (GOJJI WEIGHT SCALE) MISC 1 Device by Does not apply route daily as needed. To weight self daily as needed at home. ICD-10 code: O09.90 1 each 0   . sertraline (ZOLOFT) 25 MG tablet Take 1 tablet (25 mg total) by mouth daily. 30 tablet 3     Review of Systems   Constitutional: Negative for fever and chills Eyes: Negative for visual disturbances Respiratory: Negative for shortness of breath, dyspnea Cardiovascular: Negative for chest pain or palpitations  Gastrointestinal: Negative for vomiting, diarrhea and constipation.  POSITIVE for abdominal pain (contractions) Genitourinary: Negative for dysuria and urgency Musculoskeletal: Negative for back pain, joint pain, myalgias  Neurological: Negative for dizziness and headaches  Last menstrual period 03/30/2019, unknown if  currently breastfeeding. General appearance: alert, cooperative and no distress Lungs: normal respiratory effort Heart: regular rate and rhythm Abdomen: soft, non-tender; bowel sounds normal Extremities: Homans sign is negative, no sign of DVT DTR's 2+ Presentation: cephalic Fetal monitoring  Baseline: 140 bpm, Variability: Good {> 6 bpm), Accelerations: Reactive and Decelerations: Absent Uterine activity  q2 Dilation: 3 Effacement (%): 90 Station: Ballotable Exam by:: Erle Crocker, RN   Prenatal labs: ABO, Rh: O/Positive/-- (04/08 1523) Antibody: Negative (04/08 1523) Rubella: 1.61 (04/08 1523) RPR: Non Reactive (04/08 1523)  HBsAg: Negative (04/08 1523)  HIV: Non Reactive (04/08 1523)  GBS:   PCR pending  Nursing Staff Provider  Office Location  [facility] Dating    Language  English Anatomy US  normal  Flu Vaccine   Genetic Screen  NIPS:  normal    TDaP vaccine    Hgb A1C or  GTT Early 5.1 Third trimester normal  Rhogam     LAB RESULTS     Blood Type O/Positive/-- (04/08 1523)   Feeding Plan Breast Antibody Negative (04/08 1523)  Contraception BTL Rubella 1.61 (04/08 1523)  Circumcision Boy, yes RPR Non Reactive (04/08 1523)   Pediatrician  undecided HBsAg Negative (04/08 1523)   Support Person FOB- HCVAb Negative**Positive  Prenatal Classes No HIV Non Reactive (04/08 1523)     BTL Consent  GBS PCR pending  VBAC Consent  Pap     Hgb Electro    BP Cuff Has at home CF   Weight Scale Rx [facility] 06/15/19 SMA     Waterbirth  [ ]  Class [ ]  Consent [ ]  CNM visit    Prenatal Transfer Tool  Maternal Diabetes: No Genetic Screening: Normal Maternal Ultrasounds/Referrals: Normal Fetal Ultrasounds or other Referrals:  None Maternal Substance Abuse:  No Significant Maternal Medications:  None Significant Maternal Lab Results: GBS pending  No results found for this or any previous visit (from the past 24 hour(s)).  Assessment: Andrea Romero is a 29 y.o.  with an IUP at [redacted]w[redacted]d presenting for early labor/abruption  Plan: #Labor: expectant management>monitor bleeding #Pain:  Per request #FWB Cat 1 #ID: GBS: PCR pending  #MOF:  breast #MOC: Wanted BTL but no papers signed yet #Circ:  yes   34 12/15/2019, 4:41 AM

## 2019-12-16 ENCOUNTER — Encounter (HOSPITAL_COMMUNITY): Payer: Self-pay | Admitting: Anesthesiology

## 2019-12-16 ENCOUNTER — Encounter (HOSPITAL_COMMUNITY)
Admission: AD | Disposition: A | Discharge status: Home / Self Care | Payer: Self-pay | Source: Home / Self Care | Attending: Family Medicine

## 2019-12-16 SURGERY — LIGATION, FALLOPIAN TUBE, POSTPARTUM
Anesthesia: Choice

## 2019-12-16 MED ORDER — ONDANSETRON HCL 4 MG/2ML IJ SOLN
INTRAMUSCULAR | Status: AC
  Filled 2019-12-16: qty 2

## 2019-12-16 MED ORDER — MIDAZOLAM HCL 2 MG/2ML IJ SOLN
INTRAMUSCULAR | Status: AC
  Filled 2019-12-16: qty 2

## 2019-12-16 MED ORDER — FENTANYL CITRATE (PF) 100 MCG/2ML IJ SOLN
INTRAMUSCULAR | Status: AC
  Filled 2019-12-16: qty 2

## 2019-12-16 MED ORDER — BUPIVACAINE HCL (PF) 0.25 % IJ SOLN
INTRAMUSCULAR | Status: AC
  Filled 2019-12-16: qty 30

## 2019-12-16 NOTE — Progress Notes (Signed)
After speaking with Dr. Ashok Pall, patient declines BTL and stated she would like the nexplanon instead. Notified Dr. Ashok Pall and Marrion Coy, RN in [place]. Dr. Ashok Pall ordered regular diet now for patient. Earl Gala, Linda Hedges [place]

## 2019-12-16 NOTE — Progress Notes (Signed)
Notified on call resident that patient would like early discharge today. Pediatrician plans to send infant home after 24 hours. Will plan to send home. Awaiting discharge orders.  Earl Gala, Linda Hedges [place]

## 2019-12-16 NOTE — Anesthesia Postprocedure Evaluation (Signed)
Anesthesia Post Note  Patient: Andrea Romero  Procedure(s) Performed: AN AD HOC LABOR EPIDURAL     Patient location during evaluation: Mother Baby Anesthesia Type: Epidural Level of consciousness: awake and alert Pain management: pain level controlled Vital Signs Assessment: post-procedure vital signs reviewed and stable Respiratory status: spontaneous breathing, nonlabored ventilation and respiratory function stable Cardiovascular status: stable Postop Assessment: no headache, no backache, epidural receding, no apparent nausea or vomiting, patient able to bend at knees, adequate PO intake and able to ambulate Anesthetic complications: no   No complications documented.  Last Vitals:  Vitals:   12/16/19 0430 12/16/19 0800  BP: 115/69 110/74  Pulse: 86 73  Resp: 18 16  Temp: 36.8 C 36.7 C  SpO2: 99% 100%    Last Pain:  Vitals:   12/16/19 0800  TempSrc: Oral  PainSc: 4    Pain Goal: Patients Stated Pain Goal: 2 (12/16/19 0800)              Epidural/Spinal Function Cutaneous sensation: Normal sensation (12/16/19 0800)  Donnalee Curry [place]

## 2019-12-16 NOTE — Anesthesia Preprocedure Evaluation (Deleted)
Anesthesia Evaluation  Patient identified by MRN, date of birth, ID band Patient awake    Reviewed: Allergy & Precautions, Patient's Chart, lab work & pertinent test results  Airway Mallampati: II  TM Distance: >3 FB Neck ROM: Full    Dental no notable dental hx.    Pulmonary neg pulmonary ROS,    Pulmonary exam normal breath sounds clear to auscultation       Cardiovascular negative cardio ROS Normal cardiovascular exam Rhythm:Regular Rate:Normal     Neuro/Psych PSYCHIATRIC DISORDERS Depression negative neurological ROS     GI/Hepatic negative GI ROS, Neg liver ROS,   Endo/Other  Obesity BMI 32  Renal/GU negative Renal ROS  negative genitourinary   Musculoskeletal negative musculoskeletal ROS (+)   Abdominal (+) + obese,   Peds negative pediatric ROS (+)  Hematology  (+) Blood dyscrasia, anemia , hct 33, plt 232   Anesthesia Other Findings   Reproductive/Obstetrics (+) Pregnancy G5, partial abruption, dilated to 5-6cm in MAU                             Anesthesia Physical  Anesthesia Plan  ASA: III and emergent  Anesthesia Plan: Epidural   Post-op Pain Management:    Induction:   PONV Risk Score and Plan: 2  Airway Management Planned: Natural Airway  Additional Equipment: None  Intra-op Plan:   Post-operative Plan:   Informed Consent: I have reviewed the patients History and Physical, chart, labs and discussed the procedure including the risks, benefits and alternatives for the proposed anesthesia with the patient or authorized representative who has indicated his/her understanding and acceptance.       Plan Discussed with: Anesthesiologist and CRNA  Anesthesia Plan Comments:         Anesthesia Quick Evaluation

## 2019-12-16 NOTE — Lactation Note (Signed)
This note was copied from a baby's chart. Lactation Consultation Note  Patient Name: Andrea Romero [id number] Date: 12/16/2019 Reason for consult: Initial assessment;Infant weight loss;Early term 41-38.6wks  Visited with mom of a 52 hours old ETI female, she's a P3 and experienced BF. She BF her twins for a total of 5 months, but there was a lot of pumping and bottle feeding because they're in the NICU for the first month. She has an Elvie DEBP at home, and a hand pump in her room. LC reviewed breastmilk storage guidelines.  Mom already doing hand expression when entering the room and she was getting some drops, praised her for her efforts. She told LC that baby just had a feeding for 5 minutes, but noticed that RN charted on Flowsheets that baby didn't latch. Asked mom to call for assistance when needed. Reviewed normal newborn behavior, cluster feeding, feeding cues, size of baby's stomach and lactogenesis II.  Mom and baby might be going home today, she requested an early discharge, baby is at 3% weight loss. Reviewed discharge instructions, engorgement prevention, treatment, treatment/prevention of sore nipples and red flags on when to call baby's doctor. Mom already had a 4 pack of Similac 20 calorie formula in the room, but she said she's not planning on using it yet, she's more geared towards BF. Reviewed formula supplementation guidelines since mom already has it in the room and baby is an ETI.  Feeding plan:  1. Encouraged mom to feed baby STS 8-12 times/24 hours or sooner if feeding cues are present 2. Hand expression/pumping were also encouraged 3. If mom decides to start supplementing with formula at some point, she'll follow formula supplementation guidelines according to baby's age in hours.  BF brochure, BF resources and feeding diary were reviewed. Dad present at the time of [facility] consultation, baby was on his arms. Parents reported all questions and concerns were answered, they're  both aware of LC OP services and will call PRN.   Maternal Data Formula Feeding for Exclusion: No Has patient been taught Hand Expression?: Yes Does the patient have breastfeeding experience prior to this delivery?: Yes  Feeding Feeding Type: Breast Fed  LATCH Score                   Interventions Interventions: Breast feeding basics reviewed;Hand pump  Lactation Tools Discussed/Used Tools: Pump Breast pump type: Manual WIC Program: No Pump Review: Setup, frequency, and cleaning;Milk Storage Initiated by:: RN and MPeck (breastmilk storage guidelines) Date initiated:: 12/16/19   Consult Status Consult Status: Complete Date: 12/16/19 Follow-up type: Call as needed    Andrea Romero Andrea Romero 12/16/2019, 2:18 PM

## 2019-12-17 ENCOUNTER — Telehealth: Payer: Self-pay | Admitting: General Practice

## 2019-12-17 DIAGNOSIS — Z30017 Encounter for initial prescription of implantable subdermal contraceptive: Secondary | ICD-10-CM

## 2019-12-17 MED ORDER — LIDOCAINE HCL 1 % IJ SOLN
0.0000 mL | Freq: Once | INTRAMUSCULAR | Status: AC | PRN
  Administered 2019-12-17: 20 mL via INTRADERMAL
  Filled 2019-12-17: qty 20

## 2019-12-17 MED ORDER — IBUPROFEN 800 MG PO TABS: 800.0000 mg | ORAL_TABLET | Freq: Three times a day (TID) | ORAL | 1 refills | Status: DC | PRN

## 2019-12-17 MED ORDER — ETONOGESTREL 68 MG ~~LOC~~ IMPL
68.0000 mg | DRUG_IMPLANT | Freq: Once | SUBCUTANEOUS | Status: AC
  Administered 2019-12-17: 68 mg via SUBCUTANEOUS
  Filled 2019-12-17: qty 1

## 2019-12-17 NOTE — Discharge Instructions (Signed)

## 2019-12-17 NOTE — Procedures (Addendum)
Post-Placental Nexplanon Insertion Procedure Note  Patient was identified. Informed consent was signed, signed copy in chart. A time-out was performed.    The insertion site was identified 8-10 cm (3-4 inches) from the medial epicondyle of the humerus and 3-5 cm (1.25-2 inches) posterior to (below) the sulcus (groove) between the biceps and triceps muscles of the patient's left arm and marked. The site was preppedin the usual sterile fashion. Pt was prepped with alcohol swab and then injected with 5 cc of 1% lidocaine. The site was prepped with betadine. Nexplanon removed form packaging,  Device confirmed in needle, then inserted full length of needle and withdrawn per handbook instructions. Provider and patient verified presence of the implant in the woman's arm by palpation. Pt insertion site was covered with adhesive bandage and pressure bandage. There was minimal blood loss. Patient tolerated procedure well.  Patient was given post procedure instructions and Nexplanon user card with expiration date. Condoms were recommended for STI prevention. Patient was asked to keep the pressure dressing on for 24 hours to minimize bruising and keep the adhesive bandage on for 3-5 days. The patient verbalized understanding of the plan of care and agrees.   Lot # [id number] Expiration Date09/23/2024   Attestation of Supervision of Student:  I confirm that I have verified the information documented in the  resident  student's note and that I have also personally reperformed the history, physical exam and all medical decision making activities.  I have verified that all services and findings are accurately documented in this student's note; and I agree with management and plan as outlined in the documentation. I have also made any necessary editorial changes.    Marylene Land, CNM [facility] for [employer], [facility] 12/17/2019 10:12 AM

## 2019-12-18 ENCOUNTER — Telehealth: Payer: Self-pay

## 2019-12-18 NOTE — Telephone Encounter (Signed)
Transition Care Management Follow-up Telephone Call  Date of discharge and from where:  12/17/2019 [facility]   How have you been since you were released from the hospital? Doing well, no complaints   Any questions or concerns? No  Items Reviewed:  Did the pt receive and understand the discharge instructions provided? Yes   Medications obtained and verified? Yes   Any new allergies since your discharge? No   Dietary orders reviewed? Yes  Do you have support at home? Yes   Functional Questionnaire: (I = Independent and D = Dependent) ADLs: I  Bathing/Dressing- I  Meal Prep- I  Eating- I  Maintaining continence- I  Transferring/Ambulation- I  Managing Meds- I  Follow up appointments reviewed  PCP Hospital f/u appt confirmed? No  she is going to schedule to see OB in 4 weeks.  Specialist Hospital f/u appt confirmed? No    Are transportation arrangements needed? No   If their condition worsens, is the pt aware to call PCP or go to the Emergency Dept.? Yes  Was the patient provided with contact information for the PCP's office or ED? Yes Was to pt encouraged to call back with questions or concerns? Yes  TA/CMA

## 2021-05-08 ENCOUNTER — Encounter: Payer: Self-pay | Admitting: Nurse Practitioner

## 2021-05-08 ENCOUNTER — Ambulatory Visit (INDEPENDENT_AMBULATORY_CARE_PROVIDER_SITE_OTHER): Payer: Medicaid Other | Admitting: Nurse Practitioner

## 2021-05-08 ENCOUNTER — Other Ambulatory Visit: Payer: Self-pay

## 2021-05-08 VITALS — BP 118/60 | HR 89 | Temp 97.8°F | Ht <= 58 in | Wt 131.7 lb

## 2021-05-08 DIAGNOSIS — F411 Generalized anxiety disorder: Secondary | ICD-10-CM

## 2021-05-08 DIAGNOSIS — R5383 Other fatigue: Secondary | ICD-10-CM | POA: Diagnosis not present

## 2021-05-08 DIAGNOSIS — Z7689 Persons encountering health services in other specified circumstances: Secondary | ICD-10-CM | POA: Diagnosis not present

## 2021-05-08 DIAGNOSIS — R635 Abnormal weight gain: Secondary | ICD-10-CM | POA: Insufficient documentation

## 2021-05-08 HISTORY — DX: Generalized anxiety disorder: F41.1

## 2021-05-08 MED ORDER — ESCITALOPRAM OXALATE 5 MG PO TABS: 5.0000 mg | ORAL_TABLET | Freq: Every day | ORAL | 2 refills | Status: DC

## 2021-05-08 NOTE — Progress Notes (Signed)
New Patient Office Visit  Subjective:  Patient ID: Andrea Romero, female    DOB: November 16, 1990  Age: 31 y.o. MRN: [id number]  CC:  Chief Complaint  Patient presents with   New Patient (Initial Visit)    HPI Andrea Romero presents to establish new primary care provider. She has recently moved to the area. She does have a local OB/GYN provider. She believes she is [facility] to date with [facility].  -struggles with depression and anxiety. Feels like it is getting worse, especially since she has had children. She has been on two or three different antidepressants in the past. She has tried sertraline and lexapro. She did not do well with sertraline. Felt like she did well with lexapro. Feels like she has very high highs and very low lows. Would like to have much more good days. Feels like birth control really interferes with her mood and her weight. She currently has nexplanon. She states that she is constantly having spotting of some sort. Has frequent headaches. She has four children and she states that she does not want to have more children. Has made appointment with GYN provider to have this removed and potentially have tubal ligation. This appointment is scheduled for June 13, 2021.  -history of drug addiction.  -has nasal and sinus congestion. States that all four of her children have recently gotten over a cold. She has some sinus pain and pressure. Is taking OTC medication to help.   Past Medical History:  Diagnosis Date   Medical history non-contributory     Past Surgical History:  Procedure Laterality Date   NO PAST SURGERIES      Family History  Problem Relation Age of Onset   Ovarian cancer Mother    Ovarian cancer Maternal Grandmother    Lung cancer Maternal Grandmother     Social History   Socioeconomic History   Marital status: Significant Other    Spouse name: Ephriam Knuckles   Number of children: 3   Years of education: Not on file   Highest education level: High school  graduate  Occupational History   Occupation: Unemployed  Tobacco Use   Smoking status: Never   Smokeless tobacco: Never  Vaping Use   Vaping Use: Never used  Substance and Sexual Activity   Alcohol use: Yes   Drug use: No   Sexual activity: Yes    Birth control/protection: None    Comment: patient wants birthcontrol pill  Other Topics Concern   Not on file  Social History Narrative   Not on file   Social Determinants of Health   Financial Resource Strain: Not on file  Food Insecurity: Not on file  Transportation Needs: Not on file  Physical Activity: Not on file  Stress: Not on file  Social Connections: Not on file  Intimate Partner Violence: Not on file    ROS Review of Systems  Constitutional:  Positive for fatigue. Negative for activity change, appetite change and fever.       Having trouble with losing weight. States that she is at the heaviest she has ever been, non pregnant.   HENT:  Negative for congestion, postnasal drip, rhinorrhea, sinus pressure, sinus pain, sneezing and sore throat.   Eyes: Negative.   Respiratory:  Negative for cough, chest tightness, shortness of breath and wheezing.   Cardiovascular:  Negative for chest pain and palpitations.  Gastrointestinal:  Negative for abdominal pain, constipation, diarrhea, nausea and vomiting.  Endocrine: Negative for cold intolerance, heat intolerance,  polydipsia and polyuria.  Genitourinary:  Negative for dyspareunia, dysuria, flank pain, frequency and urgency.  Musculoskeletal:  Negative for arthralgias, back pain and myalgias.  Skin:  Negative for rash.  Allergic/Immunologic: Negative for environmental allergies.  Neurological:  Negative for dizziness, weakness and headaches.  Hematological:  Negative for adenopathy.  Psychiatric/Behavioral:  Positive for dysphoric mood. The patient is nervous/anxious.    Objective:   Today's Vitals: BP 118/60    Pulse 89    Temp 97.8 F (36.6 C)    Ht 4\' 9"  (1.448 m)     Wt 131 lb 11.2 oz (59.7 kg)    LMP 05/05/2021    SpO2 99%    BMI 28.50 kg/m   Physical Exam Vitals and nursing note reviewed.  Constitutional:      Appearance: Normal appearance. She is well-developed.  HENT:     Head: Normocephalic and atraumatic.  Eyes:     Pupils: Pupils are equal, round, and reactive to light.  Cardiovascular:     Rate and Rhythm: Normal rate and regular rhythm.     Pulses: Normal pulses.     Heart sounds: Normal heart sounds.  Pulmonary:     Effort: Pulmonary effort is normal.     Breath sounds: Normal breath sounds.  Abdominal:     Palpations: Abdomen is soft.  Musculoskeletal:        General: Normal range of motion.     Cervical back: Normal range of motion and neck supple.  Lymphadenopathy:     Cervical: No cervical adenopathy.  Skin:    General: Skin is warm and dry.     Capillary Refill: Capillary refill takes less than 2 seconds.  Neurological:     General: No focal deficit present.     Mental Status: She is alert and oriented to person, place, and time.  Psychiatric:        Mood and Affect: Mood normal.        Behavior: Behavior normal.        Thought Content: Thought content normal.        Judgment: Judgment normal.    Assessment & Plan:  1. Encounter to establish care Appointment today to establish new primary care provider    2. Generalized anxiety disorder Start lexapro 5mg  daily. Will check routine labs. Consider referral to psychiatry in future.  - escitalopram (LEXAPRO) 5 MG tablet; Take 1 tablet (5 mg total) by mouth daily.  Dispense: 30 tablet; Refill: 2  3. Abnormal weight gain Check routine, fasting labs, including full thyroid panel.   4. Other fatigue Check for anemia. Treat as indicated.    Problem List Items Addressed This Visit       Other   Generalized anxiety disorder   Relevant Medications   escitalopram (LEXAPRO) 5 MG tablet   Abnormal weight gain   Other fatigue   Other Visit Diagnoses     Encounter to  establish care    -  Primary       Outpatient Encounter Medications as of 05/08/2021  Medication Sig   escitalopram (LEXAPRO) 5 MG tablet Take 1 tablet (5 mg total) by mouth daily.   Prenatal Vit-Fe Fumarate-FA (PRENATAL VITAMIN PO) Take 1 tablet by mouth at bedtime.    cyclobenzaprine (FLEXERIL) 10 MG tablet Take 10 mg by mouth 3 (three) times daily as needed for pain. (Patient not taking: Reported on 05/08/2021)   ibuprofen (ADVIL) 800 MG tablet Take 1 tablet (800 mg total) by mouth every 8 (  eight) hours as needed for cramping. (Patient not taking: Reported on 05/08/2021)   [DISCONTINUED] sertraline (ZOLOFT) 25 MG tablet Take 1 tablet (25 mg total) by mouth daily. (Patient not taking: Reported on 05/08/2021)   No facility-administered encounter medications on file as of 05/08/2021.    Follow-up: Return in about 4 weeks (around 06/05/2021) for health maintenance exam, FBW a week prior to visit, add Free T4, vit. D, ferritin, B12.   Carlean Jews, NP

## 2021-05-11 ENCOUNTER — Telehealth: Payer: Self-pay | Admitting: Nurse Practitioner

## 2021-05-11 NOTE — Telephone Encounter (Signed)
Patient called and stated that at her last visit she had mentioned having a sinus infection and if it didn't better to call-her sinus infection is not so bad but she is stating she thinks she has an ear infection and wants to know if you can send in anything? Please advise. [phone]

## 2021-05-12 ENCOUNTER — Other Ambulatory Visit: Payer: Self-pay | Admitting: Nurse Practitioner

## 2021-05-12 DIAGNOSIS — J0140 Acute pansinusitis, unspecified: Secondary | ICD-10-CM

## 2021-05-12 MED ORDER — FLUTICASONE PROPIONATE 50 MCG/ACT NA SUSP: 2.0000 | Freq: Every day | NASAL | 6 refills | Status: DC

## 2021-05-12 MED ORDER — AZITHROMYCIN 250 MG PO TABS: ORAL_TABLET | ORAL | 0 refills | Status: DC

## 2021-05-12 NOTE — Telephone Encounter (Signed)
Patient is aware 

## 2021-05-12 NOTE — Telephone Encounter (Signed)
Please let the patient know that I have sent prescriptions for z -pack and flonase to [facility]. Z-pack, take as directed for 5 days. For flonase,she can use 1 to 2 sprays in both nostrils daily. I recommend OTC DayQuil and NyQuil to treat acute symptoms.  Thanks so much.   -HB

## 2021-05-23 ENCOUNTER — Other Ambulatory Visit: Payer: Self-pay

## 2021-05-23 DIAGNOSIS — E611 Iron deficiency: Secondary | ICD-10-CM

## 2021-05-23 DIAGNOSIS — E538 Deficiency of other specified B group vitamins: Secondary | ICD-10-CM

## 2021-05-23 DIAGNOSIS — Z0000 Encounter for general adult medical examination without abnormal findings: Secondary | ICD-10-CM

## 2021-05-23 DIAGNOSIS — E559 Vitamin D deficiency, unspecified: Secondary | ICD-10-CM

## 2021-05-23 DIAGNOSIS — Z7689 Persons encountering health services in other specified circumstances: Secondary | ICD-10-CM

## 2021-05-23 DIAGNOSIS — R5383 Other fatigue: Secondary | ICD-10-CM

## 2021-05-29 ENCOUNTER — Other Ambulatory Visit: Payer: Medicaid Other

## 2021-06-05 ENCOUNTER — Other Ambulatory Visit: Payer: Self-pay

## 2021-06-05 ENCOUNTER — Ambulatory Visit (INDEPENDENT_AMBULATORY_CARE_PROVIDER_SITE_OTHER): Payer: Medicaid Other | Admitting: Nurse Practitioner

## 2021-06-05 ENCOUNTER — Encounter: Payer: Self-pay | Admitting: Nurse Practitioner

## 2021-06-05 VITALS — BP 102/68 | HR 78 | Temp 98.3°F | Ht <= 58 in | Wt 129.4 lb

## 2021-06-05 DIAGNOSIS — Z7689 Persons encountering health services in other specified circumstances: Secondary | ICD-10-CM

## 2021-06-05 DIAGNOSIS — Z0001 Encounter for general adult medical examination with abnormal findings: Secondary | ICD-10-CM

## 2021-06-05 DIAGNOSIS — F411 Generalized anxiety disorder: Secondary | ICD-10-CM | POA: Diagnosis not present

## 2021-06-05 DIAGNOSIS — Z6828 Body mass index (BMI) 28.0-28.9, adult: Secondary | ICD-10-CM | POA: Diagnosis not present

## 2021-06-05 DIAGNOSIS — E538 Deficiency of other specified B group vitamins: Secondary | ICD-10-CM | POA: Diagnosis not present

## 2021-06-05 DIAGNOSIS — Z1329 Encounter for screening for other suspected endocrine disorder: Secondary | ICD-10-CM

## 2021-06-05 DIAGNOSIS — Z130 Encounter for screening for diseases of the blood and blood-forming organs and certain disorders involving the immune mechanism: Secondary | ICD-10-CM

## 2021-06-05 DIAGNOSIS — Z1321 Encounter for screening for nutritional disorder: Secondary | ICD-10-CM

## 2021-06-05 DIAGNOSIS — Z13228 Encounter for screening for other metabolic disorders: Secondary | ICD-10-CM | POA: Diagnosis not present

## 2021-06-05 DIAGNOSIS — R5383 Other fatigue: Secondary | ICD-10-CM

## 2021-06-05 DIAGNOSIS — E559 Vitamin D deficiency, unspecified: Secondary | ICD-10-CM

## 2021-06-05 DIAGNOSIS — Z0000 Encounter for general adult medical examination without abnormal findings: Secondary | ICD-10-CM

## 2021-06-05 NOTE — Progress Notes (Unsigned)
Established patient visit   Patient: Andrea Romero   DOB: 1991-02-02   31 y.o. Female  MRN: [id number] Visit Date: 06/05/2021   Chief Complaint  Patient presents with   Annual Exam   Subjective    HPI  The patient presents for annual exam.  --struggles with depression and anxiety. Feels like it is getting worse, especially since she has had children. She has been on two or three different antidepressants in the past. She has tried sertraline and lexapro. She did not do well with sertraline. Did a trial of lexapro since her last visit. She has subsequently stopped this medication. States that this made her feel even worse.  Feels like birth control really interferes with her mood and her weight. She currently has nexplanon. She continues to have constant spotting. Has frequent headaches. She has four children and she states that she does not want to have more children. Has made appointment with GYN provider to have this removed and potentially have tubal ligation. This appointment is scheduled for June 13, 2021.  She does not want to start any other medications for depression/anxiety until after she has Nexplanon removed and her body has the opportunity to adjust to normal hormonal flow.  -she has no new concerns or complaints today  -needs to have routine, fasting blood work done.    Medications: Outpatient Medications Prior to Visit  Medication Sig   azithromycin (ZITHROMAX) 250 MG tablet z-pack - take as directed for 5 days   escitalopram (LEXAPRO) 5 MG tablet Take 1 tablet (5 mg total) by mouth daily.   fluticasone (FLONASE) 50 MCG/ACT nasal spray Place 2 sprays into both nostrils daily.   Prenatal Vit-Fe Fumarate-FA (PRENATAL VITAMIN PO) Take 1 tablet by mouth at bedtime.    cyclobenzaprine (FLEXERIL) 10 MG tablet Take 10 mg by mouth 3 (three) times daily as needed for pain. (Patient not taking: Reported on 05/08/2021)   ibuprofen (ADVIL) 800 MG tablet Take 1 tablet (800 mg total) by  mouth every 8 (eight) hours as needed for cramping. (Patient not taking: Reported on 05/08/2021)   No facility-administered medications prior to visit.    Review of Systems  {Labs (Optional):23779}   Objective    BP 102/68    Pulse 78    Temp 98.3 F (36.8 C)    Ht 4\' 9"  (1.448 m)    Wt 129 lb 6.4 oz (58.7 kg)    SpO2 95%    BMI 28.00 kg/m  BP Readings from Last 3 Encounters:  06/05/21 102/68  05/08/21 118/60  12/17/19 109/67    Wt Readings from Last 3 Encounters:  06/05/21 129 lb 6.4 oz (58.7 kg)  05/08/21 131 lb 11.2 oz (59.7 kg)  12/15/19 150 lb (68 kg)    Physical Exam  ***  No results found for any visits on 06/05/21.  Assessment & Plan     Problem List Items Addressed This Visit       Other   Other fatigue   Other Visit Diagnoses     Health care maintenance    -  Primary   Screening for endocrine, nutritional, metabolic and immunity disorder       Vitamin D deficiency       B12 deficiency       Encounter to establish care            No follow-ups on file.         06/07/21, NP  [facility]  at [facility] [phone] (phone) [phone] (fax)  [facility]

## 2021-06-06 DIAGNOSIS — E559 Vitamin D deficiency, unspecified: Secondary | ICD-10-CM | POA: Insufficient documentation

## 2021-06-06 DIAGNOSIS — E538 Deficiency of other specified B group vitamins: Secondary | ICD-10-CM | POA: Insufficient documentation

## 2021-06-06 DIAGNOSIS — Z6828 Body mass index (BMI) 28.0-28.9, adult: Secondary | ICD-10-CM | POA: Insufficient documentation

## 2021-06-06 HISTORY — DX: Deficiency of other specified B group vitamins: E53.8

## 2021-06-06 HISTORY — DX: Vitamin D deficiency, unspecified: E55.9

## 2021-06-06 LAB — VITAMIN B12: Vitamin B-12: 390 pg/mL (ref 232–1245)

## 2021-06-06 LAB — COMPREHENSIVE METABOLIC PANEL
ALT: 14 IU/L (ref 0–32)
AST: 18 IU/L (ref 0–40)
Albumin/Globulin Ratio: 1.8 (ref 1.2–2.2)
Albumin: 4.9 g/dL (ref 3.9–5.0)
Alkaline Phosphatase: 65 IU/L (ref 44–121)
BUN/Creatinine Ratio: 14 (ref 9–23)
BUN: 12 mg/dL (ref 6–20)
Bilirubin Total: 0.6 mg/dL (ref 0.0–1.2)
CO2: 23 mmol/L (ref 20–29)
Calcium: 9.6 mg/dL (ref 8.7–10.2)
Chloride: 102 mmol/L (ref 96–106)
Creatinine, Ser: 0.85 mg/dL (ref 0.57–1.00)
Globulin, Total: 2.7 g/dL (ref 1.5–4.5)
Glucose: 83 mg/dL (ref 70–99)
Potassium: 4.7 mmol/L (ref 3.5–5.2)
Sodium: 140 mmol/L (ref 134–144)
Total Protein: 7.6 g/dL (ref 6.0–8.5)
eGFR: 95 mL/min/{1.73_m2} (ref >59)

## 2021-06-06 LAB — CBC
Hematocrit: 37.9 % (ref 34.0–46.6)
Hemoglobin: 13.1 g/dL (ref 11.1–15.9)
MCH: 31.3 pg (ref 26.6–33.0)
MCHC: 34.6 g/dL (ref 31.5–35.7)
MCV: 91 fL (ref 79–97)
Platelets: 276 10*3/uL (ref 150–450)
RBC: 4.19 x10E6/uL (ref 3.77–5.28)
RDW: 11.8 % (ref 11.7–15.4)
WBC: 8.4 10*3/uL (ref 3.4–10.8)

## 2021-06-06 LAB — VITAMIN D 25 HYDROXY (VIT D DEFICIENCY, FRACTURES): Vit D, 25-Hydroxy: 27.4 ng/mL — ABNORMAL LOW (ref 30.0–100.0)

## 2021-06-06 LAB — LIPID PANEL
Chol/HDL Ratio: 3 ratio (ref 0.0–4.4)
Cholesterol, Total: 158 mg/dL (ref 100–199)
HDL: 52 mg/dL (ref >39)
LDL Chol Calc (NIH): 83 mg/dL (ref 0–99)
Triglycerides: 131 mg/dL (ref 0–149)
VLDL Cholesterol Cal: 23 mg/dL (ref 5–40)

## 2021-06-06 LAB — HEMOGLOBIN A1C
Est. average glucose Bld gHb Est-mCnc: 117 mg/dL
Hgb A1c MFr Bld: 5.7 % — ABNORMAL HIGH (ref 4.8–5.6)

## 2021-06-06 LAB — TSH: TSH: 1.56 u[IU]/mL (ref 0.450–4.500)

## 2021-06-06 LAB — T4, FREE: Free T4: 1.05 ng/dL (ref 0.82–1.77)

## 2021-06-07 ENCOUNTER — Other Ambulatory Visit: Payer: Self-pay

## 2021-06-07 ENCOUNTER — Other Ambulatory Visit: Payer: Self-pay | Admitting: Nurse Practitioner

## 2021-06-07 DIAGNOSIS — E559 Vitamin D deficiency, unspecified: Secondary | ICD-10-CM

## 2021-06-07 MED ORDER — ERGOCALCIFEROL 1.25 MG (50000 UT) PO CAPS: 50000.0000 [IU] | ORAL_CAPSULE | ORAL | 5 refills | Status: DC

## 2021-06-07 NOTE — Progress Notes (Signed)
Please let the patient know that labs indicate vitamin d deficiency. I have added Drisdol (high dose vitamin d) which is taken weekly for next few months to boost it up. Other labs were normal.  ?Thanks so much.   -HB

## 2021-06-13 ENCOUNTER — Ambulatory Visit: Payer: Medicaid Other | Admitting: Obstetrics & Gynecology

## 2021-06-13 ENCOUNTER — Other Ambulatory Visit: Payer: Self-pay

## 2021-06-13 ENCOUNTER — Encounter: Payer: Self-pay | Admitting: Obstetrics & Gynecology

## 2021-06-13 VITALS — BP 131/85 | HR 102 | Ht <= 58 in | Wt 130.8 lb

## 2021-06-13 DIAGNOSIS — Z3009 Encounter for other general counseling and advice on contraception: Secondary | ICD-10-CM | POA: Diagnosis not present

## 2021-06-13 DIAGNOSIS — Z3046 Encounter for surveillance of implantable subdermal contraceptive: Secondary | ICD-10-CM | POA: Diagnosis not present

## 2021-06-13 NOTE — Patient Instructions (Signed)
Nexplanon Instructions After Removal  Keep bandage clean and dry for 24 hours  May use ice/Tylenol/Ibuprofen for soreness or pain  If you develop fever, drainage or increased warmth from incision site-contact office immediately   

## 2021-06-13 NOTE — Progress Notes (Signed)
? ?GYNECOLOGY OFFICE VISIT NOTE ? ?History:  ? Andrea Romero is a 31 y.o. [id number] here today for Nexplanon removal.  This was placed postpartum on 12/17/2019. Since then, she has gained weight, had worsening headaches, anxiety and depression. She tried mood stablizers and evaluation for these symptoms by her PCP, but she feels this is all due to her Nexplanon and she wants it out. She has 4 children and wants permanent sterilization.  She denies any abnormal vaginal discharge, bleeding, pelvic pain or other concerns.  ?  ?Past Medical History:  ?Diagnosis Date  ? B12 deficiency 06/06/2021  ? Depression   ? Generalized anxiety disorder 05/08/2021  ? History of gestational diabetes 06/15/2019  ? History of substance abuse (HCC) 12/15/2019  ? Vitamin D deficiency 06/06/2021  ? ? ?Past Surgical History:  ?Procedure Laterality Date  ? NO PAST SURGERIES    ? ? ?The following portions of the patient's history were reviewed and updated as appropriate: allergies, current medications, past family history, past medical history, past social history, past surgical history and problem list.  ? ?Health Maintenance:  Normal pap on 07/02/2019.  ? ?Review of Systems:  ?Pertinent items noted in HPI and remainder of comprehensive ROS otherwise negative. ? ?Physical Exam:  ?BP 131/85 (BP Location: Left Arm, Patient Position: Sitting, Cuff Size: Normal)   Pulse (!) 102   Ht 4\' 9"  (1.448 m)   Wt 130 lb 12.8 oz (59.3 kg)   LMP 05/07/2021 (Approximate)   Breastfeeding No   BMI 28.30 kg/m?  ?CONSTITUTIONAL: Well-developed, well-nourished female in no acute distress.  ?HEENT:  Normocephalic, atraumatic. External right and left ear normal. No scleral icterus.  ?NECK: Normal range of motion, supple, no masses noted on observation ?SKIN: No rash noted. Not diaphoretic. No erythema. No pallor. Nexplanon palpated under skin in left upper arm. ?MUSCULOSKELETAL: Normal range of motion. No edema noted. ?NEUROLOGIC: Alert and oriented to person,  place, and time. Normal muscle tone coordination. No cranial nerve deficit noted. ?PSYCHIATRIC: Normal mood and affect. Normal behavior. Normal judgment and thought content. ?CARDIOVASCULAR: Normal heart rate noted ?RESPIRATORY: Effort and breath sounds normal, no problems with respiration noted ?ABDOMEN: No masses noted. No other overt distention noted.   ?PELVIC: Deferred ? ?Nexplanon Removal ?Patient identified, informed consent performed, consent signed.   Tried to convince patient not to remove this today and to wait until desired sterilization, she was adamant about removing it today.  Appropriate time out taken. Nexplanon site identified.  Area prepped in usual sterile fashon. One ml of 1% lidocaine was used to anesthetize the area at the distal end of the implant. A small stab incision was made right beside the implant on the distal portion.  The Nexplanon rod was grasped using hemostats and removed without difficulty.  There was minimal blood loss. There were no complications.  2 ml of 1% lidocaine was injected around the incision for post-procedure analgesia.  Steri-strips were applied over the small incision.  A pressure bandage was applied to reduce any bruising.  The patient tolerated the procedure well and was given post procedure instructions.  Patient is planning to use condoms for contraception. ?    ?Assessment and Plan:  ?   ?1. Encounter for Nexplanon removal ?Nexplanon removed successfully, no immediate complications. She will use condoms for interim contraception. ? ?2. Consultation for female sterilization ?Patient desires permanent sterilization.  Other reversible forms of contraception (over the counter/barrier methods; hormonal contraceptives including pill, patch, ring, Depo-Provera injection, Nexplanon implant; hormonal  IUDs [place] and [place]; nonhormonal copper IUD Paragard) were discussed with patient; she declined all these modalities. Also discussed the option of vasectomy for her female  partner; she also declined this option.  She was given the choice between laparoscopic bilateral tubal sterilization using Filshie clips or laparoscopic bilateral salpingectomy. For the Filshie clip sterilization, she was told she will have one incision in her umbilicus.  Failure risk of 1-2 % with increased risk of ectopic gestation if pregnancy occurs was also discussed with patient. For the bilateral salpingectomy, she was told that both tubes will be resected via three small incisions; the failure risk of less than 1%.  Any future pregnancies will have to be attempted via IVF or other fertility procedures.  Reiterated permanence and irreversibility of both procedures; in the case of Filshie clip application, attempts to reverse tubal sterilization are often not successful.  Also emphasized risk of regret which is noted more in patients less than the age of 35.  All questions were answered. She desires laparoscopic bilateral tubal sterilization using Filshie clips.  Other risks of the procedure were discussed with patient including but not limited to: bleeding, infection, injury to surrounding organs and need for additional procedures.  Also discussed possibility of post-tubal syndrome with menstrual irregularities and pain during periods. Patient verbalized understanding of these risks and wants to proceed with this procedure.  She was told that she will be contacted by our surgical scheduler regarding the time and date of her surgery; routine preoperative instructions of having nothing to eat or drink after midnight on the day prior to surgery and also coming to the hospital 1.5 hours prior to her time of surgery were also emphasized.  She was told she may be called for a preoperative appointment about a week prior to surgery and will be given further preoperative instructions at that visit. Printed patient education handouts about the procedure were given to the patient to review at home. Medicaid papers  were signed today. ? ?Routine preventative health maintenance measures emphasized. ?Please refer to After Visit Summary for other counseling recommendations.  ? ?Return for any gynecologic concerns.   ? ?I spent 30 minutes dedicated to the care of this patient including pre-visit review of records, face to face time with the patient discussing contraception and sterilization details. ? ? ? ?Jaynie Collins, MD, FACOG ?Obstetrician Heritage manager, Faculty Practice ?[facility] [employer], [facility] ? ? ? ? ? ? ?

## 2021-07-18 ENCOUNTER — Telehealth (INDEPENDENT_AMBULATORY_CARE_PROVIDER_SITE_OTHER): Payer: Medicaid Other | Admitting: Obstetrics & Gynecology

## 2021-07-18 ENCOUNTER — Encounter: Payer: Self-pay | Admitting: Obstetrics & Gynecology

## 2021-07-18 DIAGNOSIS — Z3009 Encounter for other general counseling and advice on contraception: Secondary | ICD-10-CM

## 2021-07-18 NOTE — Progress Notes (Signed)
? ? ?  GYNECOLOGY VIRTUAL VISIT ENCOUNTER NOTE ? ?Provider location: [facility] [employer] at [facility]  ? ?Patient location: Home ? ?I connected with [name] on 07/18/21 at  9:55 AM EDT by MyChart Video Encounter and verified that I am speaking with the correct person using two identifiers. ?  ?I discussed the limitations, risks, security and privacy concerns of performing an evaluation and management service virtually and the availability of in person appointments. I also discussed with the patient that there may be a patient responsible charge related to this service. The patient expressed understanding and agreed to proceed. ?  ?History:  ?Andrea Romero is a 31 y.o. [phone] female being evaluated followed up today before her scheduled laparoscopic  bilateral salpingectomy on 08/02/2021. She denies any abnormal vaginal discharge, bleeding, pelvic pain or other concerns.   ?  ?  ?Past Medical History:  ?Diagnosis Date  ? B12 deficiency 06/06/2021  ? Depression   ? Generalized anxiety disorder 05/08/2021  ? History of gestational diabetes 06/15/2019  ? History of substance abuse (HCC) 12/15/2019  ? Vitamin D deficiency 06/06/2021  ? ?Past Surgical History:  ?Procedure Laterality Date  ? NO PAST SURGERIES    ? ?The following portions of the patient's history were reviewed and updated as appropriate: allergies, current medications, past family history, past medical history, past social history, past surgical history and problem list.  ? ?Health Maintenance:  Normal pap on 07/02/2019.  ? ?Review of Systems:  ?Pertinent items noted in HPI and remainder of comprehensive ROS otherwise negative. ? ?Physical Exam:  ? ?General:  Alert, oriented and cooperative. Patient appears to be in no acute distress.  ?Mental Status: Normal mood and affect. Normal behavior. Normal judgment and thought content.   ?Respiratory: Normal respiratory effort, no problems with respiration noted  ?Rest of physical exam deferred due to  type of encounter ?   ?  ?Assessment and Plan:  ?   ?1. Consultation for female sterilization ?Patient is already scheduled for laparoscopic bilateral salpingectomy. For the bilateral salpingectomy, she was told that both tubes will be resected via three small incisions; the failure risk of less than 1%.  Reiterated permanence and irreversibility of procedure, any future pregnancies will have to be attempted via IVF or other fertility procedures.  Also emphasized risk of regret which is noted more in patients less than the age of 88.  All questions were answered. She still desires laparoscopic bilateral salpingectomy.  Other risks of the procedure were discussed with patient including but not limited to: bleeding, infection, injury to surrounding organs and need for additional procedures.  Also discussed possibility of post-tubal syndrome with increased pelvic pain or menstrual irregularities. . Patient verbalized understanding of these risks and wants to proceed with this procedure.  She was told that she will be contacted with further preoperative instructions by the preoperative nursing team. Printed patient education handouts about the procedure were given to the patient to review at home.  ?    ?I discussed the assessment and treatment plan with the patient. The patient was provided an opportunity to ask questions and all were answered. The patient agreed with the plan and demonstrated an understanding of the instructions. ?  ?I provided 20 minutes of face-to-face time and chart documentation during this encounter. ? ? ?Jaynie Collins, MD ?Center for [facility], [facility] ? ?

## 2021-08-02 ENCOUNTER — Encounter (HOSPITAL_COMMUNITY): Admission: RE | Payer: Self-pay | Source: Home / Self Care

## 2021-08-02 ENCOUNTER — Ambulatory Visit (HOSPITAL_COMMUNITY)
Admission: RE | Admit: 2021-08-02 | Payer: Medicaid Other | Source: Home / Self Care | Admitting: Obstetrics & Gynecology

## 2021-08-02 DIAGNOSIS — Z3009 Encounter for other general counseling and advice on contraception: Secondary | ICD-10-CM

## 2021-08-02 SURGERY — SALPINGECTOMY, BILATERAL, LAPAROSCOPIC
Anesthesia: Choice | Laterality: Bilateral

## 2022-05-03 DIAGNOSIS — Z3009 Encounter for other general counseling and advice on contraception: Secondary | ICD-10-CM | POA: Diagnosis not present

## 2022-07-19 DIAGNOSIS — Z30430 Encounter for insertion of intrauterine contraceptive device: Secondary | ICD-10-CM | POA: Diagnosis not present

## 2022-07-19 DIAGNOSIS — Z01812 Encounter for preprocedural laboratory examination: Secondary | ICD-10-CM | POA: Diagnosis not present

## 2022-07-31 DIAGNOSIS — F3289 Other specified depressive episodes: Secondary | ICD-10-CM | POA: Diagnosis not present

## 2022-08-14 DIAGNOSIS — F3289 Other specified depressive episodes: Secondary | ICD-10-CM | POA: Diagnosis not present

## 2022-08-30 DIAGNOSIS — F3289 Other specified depressive episodes: Secondary | ICD-10-CM | POA: Diagnosis not present

## 2022-09-03 ENCOUNTER — Ambulatory Visit: Payer: Medicaid Other | Admitting: Family Medicine

## 2022-09-06 ENCOUNTER — Ambulatory Visit: Payer: Medicaid Other | Admitting: Family Medicine

## 2022-09-06 NOTE — Progress Notes (Deleted)
   Acute Office Visit  Subjective:     Patient ID: Andrea Romero, female    DOB: Dec 25, 1990, 32 y.o.   MRN: [id number]  No chief complaint on file.   HPI Patient is in today for fatigue. Fatigue: Patient complains of fatigue. Symptoms began {onset:14048}. Sentinal symptom the patient feels fatigue began with: {Symptoms; fatigue:14007}. Symptoms of her fatigue have been {fatigue symptom features:14006}. Patient describes the following psychologic symptoms: {fatigue symptom psychosocial:14008}.  Patient denies {fatigue symptom ros:14007}. Symptoms have {Desc; symptom progression:19445}. Severity has been {fatigue symptom severity:14005}. Previous visits for this problem: {seen previously:14038}.    ROS Negative unless otherwise noted in HPI    Objective:    There were no vitals taken for this visit.  Physical Exam  No results found for any visits on 09/06/22.      Assessment & Plan:  There are no diagnoses linked to this encounter.  No follow-ups on file.  Melida Quitter, PA

## 2022-09-18 DIAGNOSIS — F3289 Other specified depressive episodes: Secondary | ICD-10-CM | POA: Diagnosis not present

## 2022-09-28 ENCOUNTER — Other Ambulatory Visit: Payer: Medicaid Other

## 2022-10-10 ENCOUNTER — Encounter: Payer: Medicaid Other | Admitting: Family Medicine

## 2022-10-16 ENCOUNTER — Ambulatory Visit (INDEPENDENT_AMBULATORY_CARE_PROVIDER_SITE_OTHER): Payer: Medicaid Other | Admitting: Family Medicine

## 2022-10-16 ENCOUNTER — Encounter: Payer: Self-pay | Admitting: Family Medicine

## 2022-10-16 VITALS — BP 107/71 | HR 71 | Temp 98.0°F | Ht <= 58 in | Wt 115.0 lb

## 2022-10-16 DIAGNOSIS — F5104 Psychophysiologic insomnia: Secondary | ICD-10-CM

## 2022-10-16 DIAGNOSIS — F419 Anxiety disorder, unspecified: Secondary | ICD-10-CM | POA: Diagnosis not present

## 2022-10-16 DIAGNOSIS — Z0000 Encounter for general adult medical examination without abnormal findings: Secondary | ICD-10-CM

## 2022-10-16 DIAGNOSIS — E559 Vitamin D deficiency, unspecified: Secondary | ICD-10-CM | POA: Diagnosis not present

## 2022-10-16 DIAGNOSIS — F32A Depression, unspecified: Secondary | ICD-10-CM | POA: Diagnosis not present

## 2022-10-16 MED ORDER — MIRTAZAPINE 7.5 MG PO TABS: 7.5000 mg | ORAL_TABLET | Freq: Every day | ORAL | 1 refills | Status: DC

## 2022-10-16 MED ORDER — VITAMIN D (ERGOCALCIFEROL) 1.25 MG (50000 UNIT) PO CAPS: 50000.0000 [IU] | ORAL_CAPSULE | ORAL | 0 refills | Status: DC

## 2022-10-16 NOTE — Assessment & Plan Note (Addendum)
Phq-9 score 9, GAD-7 score 11. Previously tried Lexapro and Zoloft but did not like how they made her feel.  She would like to address how anxiety is making it difficult to fall asleep before starting something like an SSRI or SNRI daily.  Start trial of mirtazapine 7.5 mg daily at that time.  We discussed the importance of starting with a low-dose to avoid next-day somnolence and common side effects.  Also provided printed educational materials about mirtazapine.  Follow-up in about 8 weeks to assess efficacy and make any necessary adjustments.

## 2022-10-16 NOTE — Assessment & Plan Note (Signed)
Start prescription strength vitamin D weekly for 12 weeks then switch to OTC vitamin D3 2000 unit daily supplement.

## 2022-10-16 NOTE — Progress Notes (Signed)
Complete physical exam  Patient: Andrea Romero   DOB: [birth year]   31 y.o. Female  MRN: [id number]  Subjective:    Chief Complaint  Patient presents with   Annual Exam    Andrea Romero is a 32 y.o. female who presents today for a complete physical exam. She reports consuming a general diet.  She stays active with her 4 children.  She generally feels well. She reports sleeping poorly. It is difficult to fall asleep due to anxiety, she cannot quiet the racing thoughts. She does not have additional problems to discuss today.    Most recent fall risk assessment:    10/16/2022    1:09 PM  Fall Risk   Number falls in past yr: 0  Injury with Fall? 0  Risk for fall due to : No Fall Risks  Follow up Falls evaluation completed     Most recent depression and anxiety screenings:    10/16/2022    1:09 PM 06/13/2021    2:07 PM  PHQ 2/9 Scores  PHQ - 2 Score 2 1  PHQ- 9 Score 9 1      10/16/2022    1:10 PM 06/05/2021    3:36 PM 06/15/2019    8:42 AM 09/17/2016    3:30 PM  GAD 7 : Generalized Anxiety Score  Nervous, Anxious, on Edge 2 1 0 0  Control/stop worrying 2 0 0 0  Worry too much - different things 3 0 1 0  Trouble relaxing 1 0 1 0  Restless 0 0 0 0  Easily annoyed or irritable 2 1 1  0  Afraid - awful might happen 1 1 3  0  Total GAD 7 Score 11 3 6  0  Anxiety Difficulty Somewhat difficult  Not difficult at all     Patient Active Problem List   Diagnosis Date Noted   Vitamin D deficiency 06/06/2021   B12 deficiency 06/06/2021   Body mass index 28.0-28.9, adult 06/06/2021   Other fatigue 05/08/2021   Anxiety and depression 07/20/2019   History of gestational diabetes 06/15/2019    Past Surgical History:  Procedure Laterality Date   NO PAST SURGERIES     Social History   Tobacco Use   Smoking status: Never   Smokeless tobacco: Never  Vaping Use   Vaping status: Never Used  Substance Use Topics   Alcohol use: Yes    Comment: OCC   Drug use: No   Family History   Problem Relation Age of Onset   Ovarian cancer Mother    Ovarian cancer Maternal Grandmother    Lung cancer Maternal Grandmother    No Known Allergies   Patient Care Team: Carlean Jews, NP as PCP - General (Family Medicine)   No outpatient medications prior to visit.   No facility-administered medications prior to visit.    Review of Systems  Constitutional:  Negative for chills, fever and malaise/fatigue.  HENT:  Negative for congestion and hearing loss.   Eyes:  Negative for blurred vision and double vision.  Respiratory:  Negative for cough and shortness of breath.   Cardiovascular:  Negative for chest pain, palpitations and leg swelling.  Gastrointestinal:  Negative for abdominal pain, constipation, diarrhea and heartburn.  Genitourinary:  Negative for frequency and urgency.  Musculoskeletal:  Negative for myalgias and neck pain.  Neurological:  Negative for headaches.  Endo/Heme/Allergies:  Negative for polydipsia.  Psychiatric/Behavioral:  Positive for depression. The patient is nervous/anxious and has insomnia.  Objective:    BP 107/71   Pulse 71   Temp 98 F (36.7 C) (Oral)   Ht 4\' 9"  (1.448 m)   Wt 115 lb (52.2 kg)   LMP 10/12/2022 (Exact Date)   SpO2 98%   BMI 24.89 kg/m    Physical Exam Constitutional:      General: She is not in acute distress.    Appearance: Normal appearance.  HENT:     Head: Normocephalic and atraumatic.     Right Ear: Tympanic membrane, ear canal and external ear normal. There is no impacted cerumen.     Left Ear: Tympanic membrane, ear canal and external ear normal. There is no impacted cerumen.     Nose: Nose normal.     Mouth/Throat:     Mouth: Mucous membranes are moist.     Pharynx: No oropharyngeal exudate or posterior oropharyngeal erythema.  Eyes:     Extraocular Movements: Extraocular movements intact.     Conjunctiva/sclera: Conjunctivae normal.     Pupils: Pupils are equal, round, and reactive to light.      Comments: Does not wear glasses or contacts  Neck:     Thyroid: No thyroid mass, thyromegaly or thyroid tenderness.  Cardiovascular:     Rate and Rhythm: Normal rate and regular rhythm.     Heart sounds: Normal heart sounds. No murmur heard.    No friction rub. No gallop.  Pulmonary:     Effort: Pulmonary effort is normal. No respiratory distress.     Breath sounds: Normal breath sounds. No wheezing, rhonchi or rales.  Abdominal:     General: Abdomen is flat. Bowel sounds are normal. There is no distension.     Palpations: There is no mass.     Tenderness: There is no abdominal tenderness. There is no guarding.  Musculoskeletal:        General: Normal range of motion.     Cervical back: Normal range of motion and neck supple.  Lymphadenopathy:     Cervical: No cervical adenopathy.  Skin:    General: Skin is warm and dry.  Neurological:     Mental Status: She is alert and oriented to person, place, and time.     Cranial Nerves: No cranial nerve deficit.     Motor: No weakness.     Deep Tendon Reflexes: Reflexes normal.  Psychiatric:        Mood and Affect: Mood normal.        Assessment & Plan:    Routine Health Maintenance and Physical Exam  Immunization History  Administered Date(s) Administered   Influenza, Seasonal, Injecte, Preservative Fre 07/04/2016   Influenza,inj,Quad PF,6+ Mos 07/04/2016, 01/23/2017   Tdap 07/16/2016, 07/08/2017, 10/05/2019    Health Maintenance  Topic Date Due   COVID-19 Vaccine (1 - 2023-24 season) Never done   PAP SMEAR-Modifier  07/02/2022   INFLUENZA VACCINE  10/25/2022   DTaP/Tdap/Td (4 - Td or Tdap) 10/04/2029   Hepatitis C Screening  Completed   HIV Screening  Completed   HPV VACCINES  Aged Out   Reviewed recent labs including CBC, CMP, lipid panel, A1C, TSH, vitamin D.  Due for pap smear, she will call [facility] to schedule.  Discussed health benefits of physical activity, and encouraged her to engage in regular  exercise appropriate for her age and condition.  Wellness examination  Vitamin D deficiency Assessment & Plan: Start prescription strength vitamin D weekly for 12 weeks then switch to OTC vitamin D3 2000 unit  daily supplement.  Orders: -     Vitamin D (Ergocalciferol); Take 1 capsule (50,000 Units total) by mouth every 7 (seven) days.  Dispense: 12 capsule; Refill: 0  Anxiety and depression Assessment & Plan: Phq-9 score 9, GAD-7 score 11. Previously tried Lexapro and Zoloft but did not like how they made her feel.  She would like to address how anxiety is making it difficult to fall asleep before starting something like an SSRI or SNRI daily.  Start trial of mirtazapine 7.5 mg daily at that time.  We discussed the importance of starting with a low-dose to avoid next-day somnolence and common side effects.  Also provided printed educational materials about mirtazapine.  Follow-up in about 8 weeks to assess efficacy and make any necessary adjustments.  Orders: -     Mirtazapine; Take 1 tablet (7.5 mg total) by mouth at bedtime. Can increase to 2 tablets nightly after 1 week if needed.  Dispense: 90 tablet; Refill: 1  Psychophysiological insomnia -     Mirtazapine; Take 1 tablet (7.5 mg total) by mouth at bedtime. Can increase to 2 tablets nightly after 1 week if needed.  Dispense: 90 tablet; Refill: 1    Return in about 8 weeks (around 12/11/2022) for follow-up for new sleep/anxiety medicine, in person or video.     Melida Quitter, PA

## 2022-10-18 DIAGNOSIS — F3289 Other specified depressive episodes: Secondary | ICD-10-CM | POA: Diagnosis not present

## 2022-11-01 DIAGNOSIS — F3289 Other specified depressive episodes: Secondary | ICD-10-CM | POA: Diagnosis not present

## 2022-11-15 DIAGNOSIS — F3289 Other specified depressive episodes: Secondary | ICD-10-CM | POA: Diagnosis not present

## 2022-11-22 DIAGNOSIS — F3289 Other specified depressive episodes: Secondary | ICD-10-CM | POA: Diagnosis not present

## 2022-11-29 DIAGNOSIS — F3289 Other specified depressive episodes: Secondary | ICD-10-CM | POA: Diagnosis not present

## 2022-12-06 DIAGNOSIS — F3289 Other specified depressive episodes: Secondary | ICD-10-CM | POA: Diagnosis not present

## 2022-12-19 DIAGNOSIS — F3289 Other specified depressive episodes: Secondary | ICD-10-CM | POA: Diagnosis not present

## 2022-12-26 ENCOUNTER — Telehealth: Payer: Medicaid Other | Admitting: Family Medicine

## 2023-01-15 DIAGNOSIS — Z309 Encounter for contraceptive management, unspecified: Secondary | ICD-10-CM | POA: Diagnosis not present

## 2023-01-31 ENCOUNTER — Telehealth: Payer: Medicaid Other | Admitting: Family Medicine

## 2023-01-31 ENCOUNTER — Encounter: Payer: Self-pay | Admitting: Family Medicine

## 2023-01-31 DIAGNOSIS — F32A Depression, unspecified: Secondary | ICD-10-CM

## 2023-01-31 DIAGNOSIS — F419 Anxiety disorder, unspecified: Secondary | ICD-10-CM

## 2023-01-31 MED ORDER — HYDROXYZINE HCL 50 MG PO TABS: 25.0000 mg | ORAL_TABLET | Freq: Every evening | ORAL | 3 refills | Status: AC | PRN

## 2023-01-31 NOTE — Progress Notes (Signed)
Virtual Visit via Video Note  I connected with Andrea Romero on 01/31/23 at  1:50 PM EST by a video enabled telemedicine application and verified that I am speaking with the correct person using two identifiers.  Patient Location: Home Provider Location: Office/clinic  I discussed the limitations, risks, security, and privacy concerns of performing an evaluation and management service by video and the availability of in person appointments. I also discussed with the patient that there may be a patient responsible charge related to this service. The patient expressed understanding and agreed to proceed.    Subjective   Patient ID: Andrea Romero, female    DOB: 11-01-1990  Age: 32 y.o. MRN: [id number]  Chief Complaint  Patient presents with   Anxiety   Insomnia    HPI Andrea Romero is a 32 y.o. female presenting today for follow up of anxiety which is also making it difficult to sleep.  At last visit, started mirtazapine 7.5 mg nightly at bedtime.  Taking medication, reports excellent compliance with treatment. Denies mood changes or SI/HI. She feels anxiety is improved since last visit but she is still experiencing significant depression.  At first, she likes the mirtazapine because it was helping her sleep, but as time went on she noticed an increase in her appetite and subsequent increase in her weight.  She also notices that it seems to worsen her depression overall.  She would be more interested in a medication that can be taken only as needed and does not need to be taken every day as she reports that she does have some good days where she does not need any medication.     01/31/2023    1:50 PM 10/16/2022    1:09 PM 06/13/2021    2:07 PM  Depression screen PHQ 2/9  Decreased Interest 0 1 0  Down, Depressed, Hopeless 0 1 1  PHQ - 2 Score 0 2 1  Altered sleeping 2 3 0  Tired, decreased energy 2 3 0  Change in appetite 2 0 0  Feeling bad or failure about yourself  0 1 0  Trouble  concentrating 0 0 0  Moving slowly or fidgety/restless 0 0 0  Suicidal thoughts 0 0 0  PHQ-9 Score 6 9 1   Difficult doing work/chores Somewhat difficult Somewhat difficult        01/31/2023    1:52 PM 10/16/2022    1:10 PM 06/05/2021    3:36 PM 06/15/2019    8:42 AM  GAD 7 : Generalized Anxiety Score  Nervous, Anxious, on Edge 0 2 1 0  Control/stop worrying 0 2 0 0  Worry too much - different things 0 3 0 1  Trouble relaxing 0 1 0 1  Restless 0 0 0 0  Easily annoyed or irritable 0 2 1 1   Afraid - awful might happen 0 1 1 3   Total GAD 7 Score 0 11 3 6   Anxiety Difficulty Not difficult at all Somewhat difficult  Not difficult at all   ROS Negative unless otherwise noted in HPI  Outpatient Medications Prior to Visit  Medication Sig   [DISCONTINUED] mirtazapine (REMERON) 7.5 MG tablet Take 1 tablet (7.5 mg total) by mouth at bedtime. Can increase to 2 tablets nightly after 1 week if needed.   [DISCONTINUED] Vitamin D, Ergocalciferol, (DRISDOL) 1.25 MG (50000 UNIT) CAPS capsule Take 1 capsule (50,000 Units total) by mouth every 7 (seven) days.   No facility-administered medications prior to visit.  Objective:     Physical Exam General: Speaking clearly in complete sentences without any shortness of breath.  Alert and oriented x3.  Normal judgment. No apparent acute distress.     Assessment & Plan:  Anxiety and depression Assessment & Plan: PHQ-9 score improved to 6, GAD-7 score improved to 0.  Given side effects, discontinue mirtazapine and switch to hydroxyzine.  May take hydroxyzine 25 to 100 mg at bedtime as needed.  Follow-up in about 2 months to assess efficacy for both sleep and stabilizing mood.     Return in about 2 months (around 04/02/2023) for follow-up for sleep and mood, switch mirtazepine to hydroxyzine, in person or video.   I discussed the assessment and treatment plan with the patient. The patient was provided an opportunity to ask questions, and all were  answered. The patient agreed with the plan and demonstrated an understanding of the instructions.   The patient was advised to call back or seek an in-person evaluation if the symptoms worsen or if the condition fails to improve as anticipated.  The above assessment and management plan was discussed with the patient. The patient verbalized understanding of and has agreed to the management plan.   Melida Quitter, PA

## 2023-01-31 NOTE — Assessment & Plan Note (Signed)
PHQ-9 score improved to 6, GAD-7 score improved to 0.  Given side effects, discontinue mirtazapine and switch to hydroxyzine.  May take hydroxyzine 25 to 100 mg at bedtime as needed.  Follow-up in about 2 months to assess efficacy for both sleep and stabilizing mood.

## 2023-05-02 ENCOUNTER — Encounter: Payer: Self-pay | Admitting: Family Medicine

## 2023-12-30 ENCOUNTER — Ambulatory Visit: Payer: Self-pay

## 2023-12-30 NOTE — Telephone Encounter (Signed)
 FYI Only or Action Required?: Action required by provider: request for appointment.  Patient was last seen in primary care on 01/31/2023 by Wallace Joesph LABOR, PA.  Called Nurse Triage reporting Abdominal Pain.  Symptoms began several weeks ago.  Interventions attempted: OTC medications: Midol , tylenol .  Symptoms are: unchanged.  Triage Disposition: See PCP Within 2 Weeks  Patient/caregiver understands and will follow disposition?: Yes  Copied from CRM [id number]. Topic: Clinical - Red Word Triage >> Dec 30, 2023  1:09 PM Dedra B wrote: Red Word that prompted transfer to Nurse Triage: Pt is experiencing lower back, abdominal pain, and occasional tingling sensation in L arm. Warm transfer to NT. Reason for Disposition  Abdominal pain is a chronic symptom (recurrent or ongoing AND present > 4 weeks)  Answer Assessment - Initial Assessment Questions No available appts today. Scheduled next available 01/01/24. Advised UC/ED if symptoms worsen.  1. LOCATION: Where does it hurt?      Abdominal pain; bellow belly button, pelvic area, sides to lower back; 3 months ago; comes and goes, been more consistent in past couple  of months. Not currently hurting. Before cycle it gets worse and during intamancy; pain comes and goes throughout the month.  Left arm; tingling sensation, today; shoulder to wrist; 0/10, denies discolored, swollen, weakness/ numbness. Reports full ROM 2. RADIATION: Does the pain shoot anywhere else? (e.g., chest, back)     Lower abd to lower back 3. ONSET: When did the pain begin? (e.g., minutes, hours or days ago)      Months ago 4. SUDDEN: Gradual or sudden onset?     suddenly 5. PATTERN Does the pain come and go, or is it constant?     Comes and goes 6. SEVERITY: How bad is the pain?  (e.g., Scale 1-10; mild, moderate, or severe)     0/10; when pain occurs 5/10 7. RECURRENT SYMPTOM: Have you ever had this type of stomach pain before? If Yes, ask: When  was the last time? and What happened that time?      Comes and goes 8. CAUSE: What do you think is causing the stomach pain? (e.g., gallstones, recent abdominal surgery)     No; have copper  IUD in placed 1 years ago; 6 months afterwards noticed changed. 9. RELIEVING/AGGRAVATING FACTORS: What makes it better or worse? (e.g., antacids, bending or twisting motion, bowel movement)     Midol , tylenol  pm; not effective 10. OTHER SYMPTOMS: Do you have any other symptoms? (e.g., back pain, diarrhea, fever, urination pain, vomiting)       Denies v/d, fever chills, urination, pain,  11. PREGNANCY: Is there any chance you are pregnant? When was your last menstrual period?       Lmp 12/20/23  Protocols used: Abdominal Pain - Female-A-AH

## 2024-01-01 ENCOUNTER — Ambulatory Visit (INDEPENDENT_AMBULATORY_CARE_PROVIDER_SITE_OTHER)

## 2024-01-01 VITALS — BP 111/76 | HR 80 | Ht <= 58 in | Wt 126.8 lb

## 2024-01-01 DIAGNOSIS — Z130 Encounter for screening for diseases of the blood and blood-forming organs and certain disorders involving the immune mechanism: Secondary | ICD-10-CM

## 2024-01-01 DIAGNOSIS — Z1329 Encounter for screening for other suspected endocrine disorder: Secondary | ICD-10-CM

## 2024-01-01 DIAGNOSIS — R1031 Right lower quadrant pain: Secondary | ICD-10-CM

## 2024-01-01 DIAGNOSIS — N946 Dysmenorrhea, unspecified: Secondary | ICD-10-CM

## 2024-01-01 DIAGNOSIS — G8929 Other chronic pain: Secondary | ICD-10-CM

## 2024-01-01 DIAGNOSIS — Z1321 Encounter for screening for nutritional disorder: Secondary | ICD-10-CM

## 2024-01-01 DIAGNOSIS — Z13228 Encounter for screening for other metabolic disorders: Secondary | ICD-10-CM

## 2024-01-01 DIAGNOSIS — M545 Low back pain, unspecified: Secondary | ICD-10-CM

## 2024-01-01 DIAGNOSIS — R1032 Left lower quadrant pain: Secondary | ICD-10-CM

## 2024-01-01 MED ORDER — IBUPROFEN 800 MG PO TABS: 800.0000 mg | ORAL_TABLET | Freq: Three times a day (TID) | ORAL | 0 refills | Status: DC | PRN

## 2024-01-01 MED ORDER — CYCLOBENZAPRINE HCL 10 MG PO TABS: 10.0000 mg | ORAL_TABLET | Freq: Three times a day (TID) | ORAL | 0 refills | Status: DC | PRN

## 2024-01-01 NOTE — Patient Instructions (Signed)
 VISIT SUMMARY: Today, we addressed your lower abdominal pain, changes in your menstrual cycle, neck pain, and lower back pain. We have prescribed medications and ordered tests to help diagnose and manage your symptoms.  YOUR PLAN: CHRONIC LOWER ABDOMINAL PAIN WITH MENSTRUAL CHANGES: You have been experiencing persistent lower abdominal pain and changes in your menstrual cycle, including heavier and more frequent periods. -Take ibuprofen  800 mg every 8 hours as needed with food. -We have ordered a transvaginal ultrasound to investigate further. -Make sure your Pap smear is up to date at your [facility] appointment on October 17th. -We have ordered comprehensive blood work including A1c, cholesterol, kidney and liver function, blood counts, and thyroid  function. -We will also obtain a urine sample for analysis.  CHRONIC LOW BACK PAIN: You have a history of lower back pain, which has been exacerbated by stress and lack of sleep. -Take Flexeril at night as prescribed. -Use over-the-counter lidocaine  patches for pain relief. -Take ibuprofen  for inflammation and pain relief.  NECK PAIN WITH TENSION-TYPE HEADACHES: You have been experiencing neck pain and tension headaches, likely due to muscle strain and stress. -Take Flexeril as prescribed. -Take ibuprofen  for inflammation and pain relief.  If you have any problems before your next visit feel free to message me via MyChart (minor issues or questions) or call the office, otherwise you may reach out to schedule an office visit.  Thank you! Saddie Sacks, PA-C

## 2024-01-02 ENCOUNTER — Other Ambulatory Visit

## 2024-01-02 DIAGNOSIS — R1031 Right lower quadrant pain: Secondary | ICD-10-CM | POA: Insufficient documentation

## 2024-01-02 DIAGNOSIS — M545 Low back pain, unspecified: Secondary | ICD-10-CM | POA: Insufficient documentation

## 2024-01-02 NOTE — Progress Notes (Signed)
 Established Patient Office Visit  Subjective   Patient ID: Andrea Romero, female    DOB: 1991/01/06  Age: 33 y.o. MRN: [id number]  Chief Complaint  Patient presents with   Abdominal Pain   Back Pain    HPI  History of Present Illness   Andrea Romero is a 33 year old female who presents with lower abdominal pain and changes in menstrual cycle.  Lower abdominal pain - Persistent lower abdominal pain for approximately six months, worsening over the past two to three months - Pain varies from cramping to sharp in quality - Pain sometimes radiates to the back - Exacerbated by pressure and worsens around menstrual period, but lingers afterward - Pain is not triggered by food and is described as cramping rather than stomach pain - Regular bowel movements  Menstrual irregularities - Menstrual periods have become more frequent over the past two to three months - Heavier and more painful bleeding - Duration of bleeding has decreased from one week to two or three days, which is unusual for her - Non-hormonal copper  IUD in plac  Lower back pain - History of lower back pain since having children - Back pain is mostly in the lower back and described as uncomfortable rather than unbearable - Exacerbated by abdominal cramping  -Pain is interfering with sleep -Denies numbness, tingling, or loss of bowel or bladder function         ROS Per HPI.    Objective:     BP 111/76   Pulse 80   Ht 4' 9 (1.448 m)   Wt 126 lb 12.8 oz (57.5 kg)   LMP 12/20/2023   SpO2 96%   BMI 27.44 kg/m    Physical Exam Constitutional:      General: She is not in acute distress.    Appearance: Normal appearance.  Cardiovascular:     Rate and Rhythm: Normal rate and regular rhythm.     Heart sounds: Normal heart sounds. No murmur heard.    No friction rub. No gallop.  Pulmonary:     Effort: Pulmonary effort is normal. No respiratory distress.     Breath sounds: Normal breath sounds.   Abdominal:     Tenderness: There is abdominal tenderness in the suprapubic area and left lower quadrant.  Musculoskeletal:        General: No swelling.     Lumbar back: Spasms and tenderness present. No deformity.  Skin:    General: Skin is warm and dry.  Neurological:     General: No focal deficit present.     Mental Status: She is alert.  Psychiatric:        Mood and Affect: Mood normal.        Behavior: Behavior normal.        Thought Content: Thought content normal.      No results found for any visits on 01/01/24.    The ASCVD Risk score (Arnett DK, et al., 2019) failed to calculate for the following reasons:   The 2019 ASCVD risk score is only valid for ages 76 to 35    Assessment & Plan:   Menses painful -     Ibuprofen ; Take 1 tablet (800 mg total) by mouth every 8 (eight) hours as needed.  Dispense: 30 tablet; Refill: 0 -     US  PELVIC COMPLETE WITH TRANSVAGINAL; Future  Severe menstrual cramps -     Ibuprofen ; Take 1 tablet (800 mg total) by mouth every 8 (eight) hours as  needed.  Dispense: 30 tablet; Refill: 0 -     US  PELVIC COMPLETE WITH TRANSVAGINAL; Future  Screening for endocrine, nutritional, metabolic and immunity disorder -     VITAMIN D  25 Hydroxy (Vit-D Deficiency, Fractures); Future -     TSH; Future -     Hemoglobin A1c; Future -     Lipid panel; Future -     Comprehensive metabolic panel with GFR; Future -     CBC with Differential/Platelet; Future -     Urinalysis, Routine w reflex microscopic; Future  Chronic midline low back pain without sciatica Assessment & Plan: Chronic low back pain likely musculoskeletal, exacerbated by stress and lack of sleep. - Prescribed Flexeril at night. - Recommended over-the-counter lidocaine  patches. - Advised ibuprofen  for inflammation and pain relief.  Orders: -     Cyclobenzaprine HCl; Take 1 tablet (10 mg total) by mouth 3 (three) times daily as needed for muscle spasms.  Dispense: 30 tablet; Refill:  0  Bilateral lower abdominal cramping Assessment & Plan: Chronic lower abdominal pain with menorrhagia and dysmenorrhea for six months. Differential includes uterine fibroids, ovarian cysts, and endometriosis.  - Prescribed ibuprofen  800 mg every 8 hours as needed with food for symptomatic relief. - Ordered transvaginal ultrasound. - Ensure Pap smear is up to date at [facility] appointment on October 17th. - Ordered comprehensive blood work including A1c, cholesterol, kidney and liver function, blood counts, and thyroid  function. - Obtained urine sample for analysis.   Return if symptoms worsen or fail to improve.    Saddie JULIANNA Sacks, PA-C

## 2024-01-02 NOTE — Assessment & Plan Note (Signed)
 Chronic low back pain likely musculoskeletal, exacerbated by stress and lack of sleep. - Prescribed Flexeril at night. - Recommended over-the-counter lidocaine  patches. - Advised ibuprofen  for inflammation and pain relief.

## 2024-01-02 NOTE — Assessment & Plan Note (Signed)
 Chronic lower abdominal pain with menorrhagia and dysmenorrhea for six months. Differential includes uterine fibroids, ovarian cysts, and endometriosis.  - Prescribed ibuprofen  800 mg every 8 hours as needed with food for symptomatic relief. - Ordered transvaginal ultrasound. - Ensure Pap smear is up to date at [facility] appointment on October 17th. - Ordered comprehensive blood work including A1c, cholesterol, kidney and liver function, blood counts, and thyroid  function. - Obtained urine sample for analysis.

## 2024-01-03 ENCOUNTER — Inpatient Hospital Stay: Admission: RE | Admit: 2024-01-03

## 2024-01-07 ENCOUNTER — Other Ambulatory Visit

## 2024-01-07 DIAGNOSIS — Z130 Encounter for screening for diseases of the blood and blood-forming organs and certain disorders involving the immune mechanism: Secondary | ICD-10-CM

## 2024-01-08 ENCOUNTER — Ambulatory Visit
Admission: RE | Admit: 2024-01-08 | Discharge: 2024-01-08 | Disposition: A | Discharge status: Home / Self Care | Source: Ambulatory Visit

## 2024-01-08 DIAGNOSIS — N946 Dysmenorrhea, unspecified: Secondary | ICD-10-CM

## 2024-01-10 DIAGNOSIS — Z0000 Encounter for general adult medical examination without abnormal findings: Secondary | ICD-10-CM | POA: Diagnosis not present

## 2024-01-13 ENCOUNTER — Ambulatory Visit: Payer: Self-pay

## 2024-01-13 ENCOUNTER — Telehealth: Payer: Self-pay

## 2024-01-13 DIAGNOSIS — N946 Dysmenorrhea, unspecified: Secondary | ICD-10-CM

## 2024-01-13 DIAGNOSIS — N83209 Unspecified ovarian cyst, unspecified side: Secondary | ICD-10-CM

## 2024-01-13 NOTE — Telephone Encounter (Signed)
 Copied from CRM #[id number]. Topic: Clinical - Lab/Test Results >> Jan 13, 2024 11:40 AM Darshell M wrote: Reason for CRM: Patient requesting CB to go over ultrasound results. CB#[phone].

## 2024-01-16 ENCOUNTER — Other Ambulatory Visit

## 2024-01-17 LAB — LIPID PANEL
Chol/HDL Ratio: 3.8 ratio (ref 0.0–4.4)
Cholesterol, Total: 191 mg/dL (ref 100–199)
HDL: 50 mg/dL (ref >39)
LDL Chol Calc (NIH): 113 mg/dL — ABNORMAL HIGH (ref 0–99)
Triglycerides: 158 mg/dL — ABNORMAL HIGH (ref 0–149)
VLDL Cholesterol Cal: 28 mg/dL (ref 5–40)

## 2024-01-17 LAB — URINALYSIS, ROUTINE W REFLEX MICROSCOPIC
Bilirubin, UA: NEGATIVE
Glucose, UA: NEGATIVE
Ketones, UA: NEGATIVE
Leukocytes,UA: NEGATIVE
Nitrite, UA: NEGATIVE
Protein,UA: NEGATIVE
Specific Gravity, UA: 1.009 (ref 1.005–1.030)
Urobilinogen, Ur: 0.2 mg/dL (ref 0.2–1.0)
pH, UA: 7.5 (ref 5.0–7.5)

## 2024-01-17 LAB — CBC WITH DIFFERENTIAL/PLATELET
Basophils Absolute: 0 x10E3/uL (ref 0.0–0.2)
Basos: 1 %
EOS (ABSOLUTE): 0.1 x10E3/uL (ref 0.0–0.4)
Eos: 3 %
Hematocrit: 39.1 % (ref 34.0–46.6)
Hemoglobin: 12.9 g/dL (ref 11.1–15.9)
Immature Grans (Abs): 0 x10E3/uL (ref 0.0–0.1)
Immature Granulocytes: 0 %
Lymphocytes Absolute: 1.6 x10E3/uL (ref 0.7–3.1)
Lymphs: 38 %
MCH: 31.2 pg (ref 26.6–33.0)
MCHC: 33 g/dL (ref 31.5–35.7)
MCV: 94 fL (ref 79–97)
Monocytes Absolute: 0.4 x10E3/uL (ref 0.1–0.9)
Monocytes: 8 %
Neutrophils Absolute: 2.2 x10E3/uL (ref 1.4–7.0)
Neutrophils: 50 %
Platelets: 262 x10E3/uL (ref 150–450)
RBC: 4.14 x10E6/uL (ref 3.77–5.28)
RDW: 11.7 % (ref 11.7–15.4)
WBC: 4.3 x10E3/uL (ref 3.4–10.8)

## 2024-01-17 LAB — COMPREHENSIVE METABOLIC PANEL WITH GFR
ALT: 17 IU/L (ref 0–32)
AST: 19 IU/L (ref 0–40)
Albumin: 4.5 g/dL (ref 3.9–4.9)
Alkaline Phosphatase: 50 IU/L (ref 41–116)
BUN/Creatinine Ratio: 12 (ref 9–23)
BUN: 8 mg/dL (ref 6–20)
Bilirubin Total: 0.6 mg/dL (ref 0.0–1.2)
CO2: 20 mmol/L (ref 20–29)
Calcium: 9.7 mg/dL (ref 8.7–10.2)
Chloride: 103 mmol/L (ref 96–106)
Creatinine, Ser: 0.69 mg/dL (ref 0.57–1.00)
Globulin, Total: 2.5 g/dL (ref 1.5–4.5)
Glucose: 93 mg/dL (ref 70–99)
Potassium: 4.4 mmol/L (ref 3.5–5.2)
Sodium: 137 mmol/L (ref 134–144)
Total Protein: 7 g/dL (ref 6.0–8.5)
eGFR: 119 mL/min/1.73 (ref >59)

## 2024-01-17 LAB — MICROSCOPIC EXAMINATION
Bacteria, UA: NONE SEEN
Casts: NONE SEEN /LPF
WBC, UA: NONE SEEN /HPF (ref 0–5)

## 2024-01-17 LAB — HEMOGLOBIN A1C
Est. average glucose Bld gHb Est-mCnc: 105 mg/dL
Hgb A1c MFr Bld: 5.3 % (ref 4.8–5.6)

## 2024-01-17 LAB — VITAMIN D 25 HYDROXY (VIT D DEFICIENCY, FRACTURES): Vit D, 25-Hydroxy: 24.7 ng/mL — ABNORMAL LOW (ref 30.0–100.0)

## 2024-01-17 LAB — TSH: TSH: 1.49 u[IU]/mL (ref 0.450–4.500)

## 2024-01-23 ENCOUNTER — Other Ambulatory Visit

## 2024-01-30 ENCOUNTER — Encounter

## 2024-03-11 ENCOUNTER — Encounter

## 2024-04-01 ENCOUNTER — Other Ambulatory Visit

## 2024-04-10 ENCOUNTER — Other Ambulatory Visit

## 2024-04-16 ENCOUNTER — Ambulatory Visit (INDEPENDENT_AMBULATORY_CARE_PROVIDER_SITE_OTHER)

## 2024-04-16 VITALS — BP 108/65 | HR 87 | Temp 98.1°F | Ht <= 58 in | Wt 128.0 lb

## 2024-04-16 DIAGNOSIS — M545 Low back pain, unspecified: Secondary | ICD-10-CM | POA: Diagnosis not present

## 2024-04-16 DIAGNOSIS — G8929 Other chronic pain: Secondary | ICD-10-CM | POA: Diagnosis not present

## 2024-04-16 DIAGNOSIS — Z0000 Encounter for general adult medical examination without abnormal findings: Secondary | ICD-10-CM | POA: Diagnosis not present

## 2024-04-16 DIAGNOSIS — N83209 Unspecified ovarian cyst, unspecified side: Secondary | ICD-10-CM | POA: Insufficient documentation

## 2024-04-16 DIAGNOSIS — F32A Depression, unspecified: Secondary | ICD-10-CM | POA: Diagnosis not present

## 2024-04-16 DIAGNOSIS — F419 Anxiety disorder, unspecified: Secondary | ICD-10-CM | POA: Diagnosis not present

## 2024-04-16 DIAGNOSIS — E559 Vitamin D deficiency, unspecified: Secondary | ICD-10-CM | POA: Diagnosis not present

## 2024-04-16 MED ORDER — CYCLOBENZAPRINE HCL 10 MG PO TABS: 10.0000 mg | ORAL_TABLET | Freq: Every day | ORAL | 0 refills | Status: AC

## 2024-04-16 NOTE — Assessment & Plan Note (Signed)
 Continue OTC supplement

## 2024-04-16 NOTE — Assessment & Plan Note (Signed)
 Ultrasound showed benign hemorrhagic cyst or corpus luteum, likely menstrual cycle related. - Continue with scheduled follow up ultrasound next week to monitor cyst.

## 2024-04-16 NOTE — Progress Notes (Signed)
 "  Complete physical exam  Patient: Andrea Romero   DOB: 1990/08/23   34 y.o. Female  MRN: [id number]  Subjective:    No chief complaint on file.   History of Present Illness   Andrea Romero is a 34 year old female who presents for an annual physical exam and discussion about IUD removal.  Contraceptive management and family planning - Scheduled for IUD removal tomorrow in anticipation of conceiving another child. - Expresses anxiety regarding the procedure. - Has four daughters, youngest is four years old. - Desires a son but prioritizes having a healthy baby.  Mood and anxiety symptoms - History of prenatal depression during first three pregnancies. - No recent depressive symptoms. - Not currently taking antidepressant medication. - Hydroxyzine  available for anxiety, but not used recently. - Feels mentally well and optimistic about avoiding prenatal depression in future pregnancy.  Musculoskeletal pain - Chronic back pain managed with Flexeril  at night for sleep and pain relief. - Does not use muscle relaxer three times daily, only at night. - Plans to incorporate stretching, yoga, and possibly chiropractic care for symptom management.  Pelvic pain and ovarian cyst - Upcoming ultrasound scheduled next week; previous ultrasounds revealed ovarian cyst. - Experiences lingering pelvic pain following menstruation.  Recent laboratory findings - October labs showed normal blood counts, kidney and liver function, and A1c. - Cholesterol slightly elevated. - Vitamin D  deficiency managed with supplementation.   Most recent fall risk assessment:    04/16/2024    8:47 AM  Fall Risk   Falls in the past year? 0  Injury with Fall? 0  Risk for fall due to : No Fall Risks  Follow up Falls evaluation completed     Most recent depression screenings:    04/16/2024    8:48 AM 01/31/2023    1:50 PM  PHQ 2/9 Scores  PHQ - 2 Score 0 0  PHQ- 9 Score 3 6      Data saved with a previous  flowsheet row definition    Vision:Within last year and Dental: No current dental problems and Receives regular dental care    Patient Care Team: Gayle Saddie JULIANNA DEVONNA as PCP - General (Physician Assistant)   Show/hide medication list[1]  ROS   Per HPI     Objective:     BP 108/65   Pulse 87   Temp 98.1 F (36.7 C) (Oral)   Ht 4' 9 (1.448 m)   Wt 128 lb 0.6 oz (58.1 kg)   LMP 04/04/2024   SpO2 97%   BMI 27.71 kg/m    Physical Exam Constitutional:      General: She is not in acute distress.    Appearance: Normal appearance.  Cardiovascular:     Rate and Rhythm: Normal rate and regular rhythm.     Heart sounds: Normal heart sounds. No murmur heard.    No friction rub. No gallop.  Pulmonary:     Effort: Pulmonary effort is normal. No respiratory distress.     Breath sounds: Normal breath sounds.  Abdominal:     General: Bowel sounds are normal.  Musculoskeletal:        General: No swelling.     Cervical back: Neck supple.  Lymphadenopathy:     Cervical: No cervical adenopathy.  Skin:    General: Skin is warm and dry.  Neurological:     General: No focal deficit present.     Mental Status: She is alert.  Psychiatric:  Mood and Affect: Mood normal.        Behavior: Behavior normal.        Thought Content: Thought content normal.       No results found for any visits on 04/16/24. Last CBC Lab Results  Component Value Date   WBC 4.3 01/16/2024   HGB 12.9 01/16/2024   HCT 39.1 01/16/2024   MCV 94 01/16/2024   MCH 31.2 01/16/2024   RDW 11.7 01/16/2024   PLT 262 01/16/2024   Last metabolic panel Lab Results  Component Value Date   GLUCOSE 93 01/16/2024   NA 137 01/16/2024   K 4.4 01/16/2024   CL 103 01/16/2024   CO2 20 01/16/2024   BUN 8 01/16/2024   CREATININE 0.69 01/16/2024   EGFR 119 01/16/2024   CALCIUM  9.7 01/16/2024   PROT 7.0 01/16/2024   ALBUMIN 4.5 01/16/2024   LABGLOB 2.5 01/16/2024   AGRATIO 1.8 06/05/2021   BILITOT  0.6 01/16/2024   ALKPHOS 50 01/16/2024   AST 19 01/16/2024   ALT 17 01/16/2024   Last lipids Lab Results  Component Value Date   CHOL 191 01/16/2024   HDL 50 01/16/2024   LDLCALC 113 (H) 01/16/2024   TRIG 158 (H) 01/16/2024   CHOLHDL 3.8 01/16/2024   Last hemoglobin A1c Lab Results  Component Value Date   HGBA1C 5.3 01/16/2024   Last thyroid  functions Lab Results  Component Value Date   TSH 1.490 01/16/2024   FREET4 1.05 06/05/2021   Last vitamin D  Lab Results  Component Value Date   VD25OH 24.7 (L) 01/16/2024        Assessment & Plan:    Routine Health Maintenance and Physical Exam  Health Maintenance  Topic Date Due   COVID-19 Vaccine (1 - 2025-26 season) 05/02/2024*   Flu Shot  06/23/2024*   Pap with HPV screening  07/01/2024   DTaP/Tdap/Td vaccine (4 - Td or Tdap) 10/04/2029   HPV Vaccine (No Doses Required) Completed   Hepatitis C Screening  Completed   HIV Screening  Completed   Pneumococcal Vaccine  Aged Out   Meningitis B Vaccine  Aged Out   Hepatitis B Vaccine  Discontinued  *Topic was postponed. The date shown is not the original due date.    Discussed health benefits of physical activity, and encouraged her to engage in regular exercise appropriate for her age and condition.  Cyst of ovary, unspecified laterality Assessment & Plan: Ultrasound showed benign hemorrhagic cyst or corpus luteum, likely menstrual cycle related. - Continue with scheduled follow up ultrasound next week to monitor cyst.   Chronic midline low back pain without sciatica Assessment & Plan: Managed with Flexeril , safe in pregnancy. Plans to use non-pharmacological methods. - Refilled Flexeril  prescription. - Encouraged non-pharmacological methods for back pain management to augment (massage, stretching, etc)  Orders: -     Cyclobenzaprine  HCl; Take 1 tablet (10 mg total) by mouth at bedtime.  Dispense: 30 tablet; Refill: 0  Anxiety and depression Assessment &  Plan: Currently asymptomatic and stable without medication. Can restart Zoloft  if mood worsens during pregnancy. Continue hydroxyzine  as needed.   Vitamin D  deficiency Assessment & Plan: Continue OTC supplement    Healthcare maintenance Assessment & Plan: Labs normal except mild cholesterol elevation. Vitamin D  low, on supplements. Discussed preconception counseling, A1c, and blood pressure monitoring. IUD removal planned, considering pregnancy. - Scheduled annual physical examination in one year with blood work the week before. - Offered pregnancy blood test at the clinic if  preferred. - Ensure follow-up with OB once pregnancy is confirmed.     Return in about 1 year (around 04/16/2025) for Physical.     Saddie JULIANNA Sacks, PA-C     [1]  Outpatient Medications Prior to Visit  Medication Sig   hydrOXYzine  (ATARAX ) 50 MG tablet Take 0.5-2 tablets (25-100 mg total) by mouth at bedtime as needed for anxiety.   [DISCONTINUED] cyclobenzaprine  (FLEXERIL ) 10 MG tablet Take 1 tablet (10 mg total) by mouth 3 (three) times daily as needed for muscle spasms.   [DISCONTINUED] ibuprofen  (ADVIL ) 800 MG tablet Take 1 tablet (800 mg total) by mouth every 8 (eight) hours as needed.   No facility-administered medications prior to visit.   "

## 2024-04-16 NOTE — Patient Instructions (Signed)
 VISIT SUMMARY: Today, we conducted your annual physical exam and discussed your upcoming IUD removal in preparation for conceiving another child. We also reviewed your chronic back pain, anxiety and depression history, and vitamin D  deficiency.  YOUR PLAN: ANNUAL PHYSICAL EXAMINATION AND PRECONCEPTION COUNSELING: Your labs were normal except for slightly elevated cholesterol and low vitamin D . We discussed preconception counseling and your upcoming IUD removal. -Schedule your annual physical examination in one year with blood work the week before. -We can offer a pregnancy blood test at the clinic if you prefer. -Ensure follow-up with your OB once pregnancy is confirmed.  CHRONIC LOW BACK PAIN: Your chronic back pain is managed with Flexeril , which is considered Category B for pregnancy (generally considered safe) You plan to use non-pharmacological methods for additional relief. -Refilled your Flexeril  prescription. -Encouraged you to use non-pharmacological methods such as stretching, yoga, and possibly chiropractic care for back pain management.   BENIGN OVARIAN CYST: Your ultrasound showed a benign hemorrhagic cyst or corpus luteum, likely related to your menstrual cycle. -Continue with the scheduled ultrasound next week to monitor the cyst.  VITAMIN D  DEFICIENCY: You have a vitamin D  deficiency that is being managed with supplements. -Continue taking your vitamin D  supplements.  If you have any problems before your next visit feel free to message me via MyChart (minor issues or questions) or call the office, otherwise you may reach out to schedule an office visit.  Thank you! Saddie Sacks, PA-C

## 2024-04-16 NOTE — Assessment & Plan Note (Signed)
 Managed with Flexeril , safe in pregnancy. Plans to use non-pharmacological methods. - Refilled Flexeril  prescription. - Encouraged non-pharmacological methods for back pain management to augment (massage, stretching, etc)

## 2024-04-16 NOTE — Assessment & Plan Note (Signed)
 Currently asymptomatic and stable without medication. Can restart Zoloft  if mood worsens during pregnancy. Continue hydroxyzine  as needed.

## 2024-04-16 NOTE — Assessment & Plan Note (Signed)
 Labs normal except mild cholesterol elevation. Vitamin D  low, on supplements. Discussed preconception counseling, A1c, and blood pressure monitoring. IUD removal planned, considering pregnancy. - Scheduled annual physical examination in one year with blood work the week before. - Offered pregnancy blood test at the clinic if preferred. - Ensure follow-up with OB once pregnancy is confirmed.

## 2024-04-22 ENCOUNTER — Ambulatory Visit: Admission: RE | Admit: 2024-04-22 | Discharge: 2024-04-22 | Disposition: A | Source: Ambulatory Visit

## 2024-04-22 DIAGNOSIS — N83209 Unspecified ovarian cyst, unspecified side: Secondary | ICD-10-CM

## 2024-04-22 DIAGNOSIS — N946 Dysmenorrhea, unspecified: Secondary | ICD-10-CM

## 2024-04-27 ENCOUNTER — Ambulatory Visit: Payer: Self-pay

## 2024-12-31 ENCOUNTER — Ambulatory Visit

## 2025-04-12 ENCOUNTER — Other Ambulatory Visit

## 2025-04-19 ENCOUNTER — Encounter
